# Patient Record
Sex: Male | Born: 1955 | Race: White | Hispanic: No | Smoking: Never smoker
Health system: Southern US, Community
[De-identification: ages and names within clinical notes are randomized; demographics above are authoritative.]

## PROBLEM LIST (undated history)

## (undated) DIAGNOSIS — I252 Old myocardial infarction: Secondary | ICD-10-CM

## (undated) DIAGNOSIS — I10 Essential (primary) hypertension: Secondary | ICD-10-CM

## (undated) DIAGNOSIS — N200 Calculus of kidney: Secondary | ICD-10-CM

## (undated) DIAGNOSIS — E785 Hyperlipidemia, unspecified: Secondary | ICD-10-CM

## (undated) DIAGNOSIS — C4490 Unspecified malignant neoplasm of skin, unspecified: Secondary | ICD-10-CM

## (undated) HISTORY — DX: Old myocardial infarction: I25.2

## (undated) HISTORY — PX: BACK SURGERY: SHX140

## (undated) HISTORY — DX: Hyperlipidemia, unspecified: E78.5

## (undated) HISTORY — DX: Calculus of kidney: N20.0

## (undated) HISTORY — DX: Unspecified malignant neoplasm of skin, unspecified: C44.90

---

## 2004-03-06 ENCOUNTER — Inpatient Hospital Stay (HOSPITAL_COMMUNITY): Admission: RE | Admit: 2004-03-06 | Discharge: 2004-03-13 | Payer: Self-pay | Admitting: Neurosurgery

## 2005-07-29 ENCOUNTER — Ambulatory Visit: Payer: Self-pay | Admitting: Internal Medicine

## 2012-12-10 ENCOUNTER — Ambulatory Visit: Payer: Self-pay | Admitting: Internal Medicine

## 2014-10-17 ENCOUNTER — Ambulatory Visit
Admission: RE | Admit: 2014-10-17 | Discharge: 2014-10-17 | Disposition: A | Discharge status: Home / Self Care | Payer: BLUE CROSS/BLUE SHIELD | Source: Ambulatory Visit | Attending: Gastroenterology | Admitting: Gastroenterology

## 2014-10-17 ENCOUNTER — Encounter
Admission: RE | Disposition: A | Discharge status: Home / Self Care | Payer: Self-pay | Source: Ambulatory Visit | Attending: Gastroenterology

## 2014-10-17 ENCOUNTER — Ambulatory Visit: Payer: BLUE CROSS/BLUE SHIELD | Admitting: Anesthesiology

## 2014-10-17 ENCOUNTER — Encounter: Payer: Self-pay | Admitting: *Deleted

## 2014-10-17 DIAGNOSIS — Z8371 Family history of colonic polyps: Secondary | ICD-10-CM | POA: Diagnosis not present

## 2014-10-17 DIAGNOSIS — Z79899 Other long term (current) drug therapy: Secondary | ICD-10-CM | POA: Diagnosis not present

## 2014-10-17 DIAGNOSIS — E785 Hyperlipidemia, unspecified: Secondary | ICD-10-CM | POA: Insufficient documentation

## 2014-10-17 DIAGNOSIS — D122 Benign neoplasm of ascending colon: Secondary | ICD-10-CM | POA: Insufficient documentation

## 2014-10-17 DIAGNOSIS — I10 Essential (primary) hypertension: Secondary | ICD-10-CM | POA: Diagnosis not present

## 2014-10-17 DIAGNOSIS — D120 Benign neoplasm of cecum: Secondary | ICD-10-CM | POA: Diagnosis not present

## 2014-10-17 DIAGNOSIS — D126 Benign neoplasm of colon, unspecified: Secondary | ICD-10-CM

## 2014-10-17 DIAGNOSIS — Z9889 Other specified postprocedural states: Secondary | ICD-10-CM | POA: Diagnosis not present

## 2014-10-17 DIAGNOSIS — Z1211 Encounter for screening for malignant neoplasm of colon: Secondary | ICD-10-CM | POA: Diagnosis not present

## 2014-10-17 DIAGNOSIS — Z791 Long term (current) use of non-steroidal anti-inflammatories (NSAID): Secondary | ICD-10-CM | POA: Insufficient documentation

## 2014-10-17 HISTORY — PX: COLONOSCOPY: SHX5424

## 2014-10-17 HISTORY — DX: Benign neoplasm of colon, unspecified: D12.6

## 2014-10-17 HISTORY — DX: Essential (primary) hypertension: I10

## 2014-10-17 SURGERY — COLONOSCOPY
Anesthesia: General

## 2014-10-17 MED ORDER — FENTANYL CITRATE (PF) 100 MCG/2ML IJ SOLN
INTRAMUSCULAR | Status: DC | PRN
  Administered 2014-10-17: 50 ug via INTRAVENOUS

## 2014-10-17 MED ORDER — METOPROLOL SUCCINATE ER 25 MG PO TB24
ORAL_TABLET | ORAL | Status: AC
  Administered 2014-10-17: 25 mg via ORAL
  Filled 2014-10-17: qty 1

## 2014-10-17 MED ORDER — SODIUM CHLORIDE 0.9 % IV SOLN
INTRAVENOUS | Status: DC
  Administered 2014-10-17: 08:00:00 via INTRAVENOUS

## 2014-10-17 MED ORDER — PROPOFOL INFUSION 10 MG/ML OPTIME
INTRAVENOUS | Status: DC | PRN
  Administered 2014-10-17: 140 ug/kg/min via INTRAVENOUS

## 2014-10-17 MED ORDER — SODIUM CHLORIDE 0.9 % IV SOLN: INTRAVENOUS | Status: DC

## 2014-10-17 MED ORDER — METOPROLOL SUCCINATE ER 25 MG PO TB24
ORAL_TABLET | ORAL | Status: AC
  Filled 2014-10-17: qty 1

## 2014-10-17 MED ORDER — METOPROLOL SUCCINATE ER 50 MG PO TB24
50.0000 mg | ORAL_TABLET | Freq: Once | ORAL | Status: AC
  Administered 2014-10-17: 25 mg via ORAL

## 2014-10-17 MED ORDER — MIDAZOLAM HCL 2 MG/2ML IJ SOLN
INTRAMUSCULAR | Status: DC | PRN
  Administered 2014-10-17: 1 mg via INTRAVENOUS

## 2014-10-17 NOTE — Transfer of Care (Signed)
Immediate Anesthesia Transfer of Care Note  Patient: Dennis Peck  Procedure(s) Performed: Procedure(s): COLONOSCOPY (N/A)  Patient Location: PACU  Anesthesia Type:General  Level of Consciousness: awake, alert  and oriented  Airway & Oxygen Therapy: Patient Spontanous Breathing and Patient connected to nasal cannula oxygen  Post-op Assessment: Report given to RN and Post -op Vital signs reviewed and stable  Post vital signs: Reviewed and stable  Last Vitals:  Filed Vitals:   10/17/14 0902  BP:   Pulse: 65  Temp:   Resp: 10    Complications: No apparent anesthesia complications

## 2014-10-17 NOTE — Anesthesia Postprocedure Evaluation (Signed)
  Anesthesia Post-op Note  Patient: Dennis Peck  Procedure(s) Performed: Procedure(s): COLONOSCOPY (N/A)  Anesthesia type:General  Patient location: PACU  Post pain: Pain level controlled  Post assessment: Post-op Vital signs reviewed, Patient's Cardiovascular Status Stable, Respiratory Function Stable, Patent Airway and No signs of Nausea or vomiting  Post vital signs: Reviewed and stable  Last Vitals:  Filed Vitals:   10/17/14 0930  BP: 140/88  Pulse: 57  Temp:   Resp: 13    Level of consciousness: awake, alert  and patient cooperative  Complications: No apparent anesthesia complications

## 2014-10-17 NOTE — Anesthesia Preprocedure Evaluation (Signed)
Anesthesia Evaluation  Patient identified by MRN, date of birth, ID band Patient awake    Reviewed: Allergy & Precautions, NPO status , Patient's Chart, lab work & pertinent test results, reviewed documented beta blocker date and time   Airway Mallampati: III  TM Distance: >3 FB     Dental  (+) Chipped   Pulmonary          Cardiovascular hypertension,     Neuro/Psych    GI/Hepatic   Endo/Other    Renal/GU      Musculoskeletal   Abdominal   Peds  Hematology   Anesthesia Other Findings Obesity.  Reproductive/Obstetrics                             Anesthesia Physical Anesthesia Plan  ASA: III  Anesthesia Plan: General   Post-op Pain Management:    Induction: Intravenous  Airway Management Planned: Nasal Cannula  Additional Equipment:   Intra-op Plan:   Post-operative Plan:   Informed Consent: I have reviewed the patients History and Physical, chart, labs and discussed the procedure including the risks, benefits and alternatives for the proposed anesthesia with the patient or authorized representative who has indicated his/her understanding and acceptance.     Plan Discussed with: CRNA  Anesthesia Plan Comments:         Anesthesia Quick Evaluation

## 2014-10-17 NOTE — Anesthesia Procedure Notes (Signed)
Date/Time: 10/17/2014 8:30 AM Performed by: Christy Sartorius Patient Re-evaluated:Patient Re-evaluated prior to inductionOxygen Delivery Method: Nasal cannula Intubation Type: IV induction Grade View: Grade III Placement Confirmation: positive ETCO2 Dental Injury: Teeth and Oropharynx as per pre-operative assessment

## 2014-10-17 NOTE — H&P (Signed)
  Primary Care Physician:  Rusty Aus., MD  Pre-Procedure History & Physical: HPI:  Dennis Peck is a 59 y.o. male is here for an colonoscopy.   Past Medical History  Diagnosis Date  . Hypertension     Past Surgical History  Procedure Laterality Date  . Back surgery      Prior to Admission medications   Medication Sig Start Date End Date Taking? Authorizing Provider  etodolac (LODINE) 400 MG tablet Take 400 mg by mouth 2 (two) times daily.   Yes Historical Provider, MD  geriatric multivitamins-minerals (ELDERTONIC/GEVRABON) ELIX Take 15 mLs by mouth daily.   Yes Historical Provider, MD  glucosamine-chondroitin 500-400 MG tablet Take 1 tablet by mouth 3 (three) times daily.   Yes Historical Provider, MD  hydrochlorothiazide (HYDRODIURIL) 50 MG tablet Take 50 mg by mouth daily.   Yes Historical Provider, MD  metoprolol succinate (TOPROL-XL) 50 MG 24 hr tablet Take 50 mg by mouth daily. Take with or immediately following a meal.   Yes Historical Provider, MD  potassium chloride (K-DUR) 10 MEQ tablet Take 10 mEq by mouth daily.   Yes Historical Provider, MD  sertraline (ZOLOFT) 50 MG tablet Take 50 mg by mouth daily.   Yes Historical Provider, MD  Specialty Vitamins Products (MAGNESIUM, AMINO ACID CHELATE,) 133 MG tablet Take 1 tablet by mouth 2 (two) times daily.   Yes Historical Provider, MD  telmisartan (MICARDIS) 80 MG tablet Take 80 mg by mouth daily.   Yes Historical Provider, MD    Allergies as of 10/07/2014  . (Not on File)    History reviewed. No pertinent family history.  History   Social History  . Marital Status: Married    Spouse Name: N/A  . Number of Children: N/A  . Years of Education: N/A   Occupational History  . Not on file.   Social History Main Topics  . Smoking status: Never Smoker   . Smokeless tobacco: Current User    Types: Chew  . Alcohol Use: 16.8 oz/week    28 Glasses of wine per week  . Drug Use: No  . Sexual Activity: Not on file    Other Topics Concern  . Not on file   Social History Narrative  . No narrative on file     Physical Exam: BP 136/85 mmHg  Pulse 72  Temp(Src) 98 F (36.7 C) (Tympanic)  Resp 14  Ht 5\' 9"  (1.753 m)  Wt 240 lb (108.863 kg)  BMI 35.43 kg/m2  SpO2 97% General:   Alert,  pleasant and cooperative in NAD Head:  Normocephalic and atraumatic. Neck:  Supple; no masses or thyromegaly. Lungs:  Clear throughout to auscultation.    Heart:  Regular rate and rhythm. Abdomen:  Soft, nontender and nondistended. Normal bowel sounds, without guarding, and without rebound.   Neurologic:  Alert and  oriented x4;  grossly normal neurologically.  Impression/Plan: Dennis Peck is here for an colonoscopy to be performed for screening  Risks, benefits, limitations, and alternatives regarding  colonoscopy have been reviewed with the patient.  Questions have been answered.  All parties agreeable.   Josefine Class, MD  10/17/2014, 8:25 AM

## 2014-10-17 NOTE — Discharge Instructions (Signed)

## 2014-10-17 NOTE — Op Note (Addendum)
[facility] Patient Name: Dennis Peck Procedure Date: 10/17/2014 8:22 AM MRN: [id number] Account #: [id number] Date of Birth: 1955/10/31 Admit Type: Outpatient Age: 59 Room: [facility] ENDO ROOM 3 Gender: Male Note Status: Supervisor Override Procedure:         Colonoscopy Indications:       Colon cancer screening in patient at increased risk:                     Family history of colon polyps, This is the patient's                     first colonoscopy Patient Profile:   This is a 59 year old male. Providers:         Gerrit Heck. Rayann Heman, MD Referring MD:      Rusty Aus, MD (Referring MD) Medicines:         Propofol per Anesthesia Complications:     No immediate complications. Procedure:         Pre-Anesthesia Assessment:                    - Prior to the procedure, a History and Physical was                     performed, and patient medications, allergies and                     sensitivities were reviewed. The patient's tolerance of                     previous anesthesia was reviewed.                    After obtaining informed consent, the colonoscope was                     passed under direct vision. Throughout the procedure, the                     patient's blood pressure, pulse, and oxygen saturations                     were monitored continuously. The Olympus CF-H180AL                     colonoscope ( S#: [id number] ) was introduced through the                     anus and advanced to the the cecum, identified by                     appendiceal orifice and ileocecal valve. The colonoscopy                     was performed without difficulty. The patient tolerated                     the procedure well. The quality of the bowel preparation                     was excellent. Findings:      The perianal and digital rectal examinations were normal.      A 10 mm polyp was found in the cecum. The polyp was flat. The polyp was  removed  with a hot snare. The polyp was removed with a saline       injection-lift technique using a hot snare. Resection and retrieval were       complete.      Two sessile polyps were found in the ascending colon and in the proximal       ascending colon. The polyps were 3 to 4 mm in size. These polyps were       removed with a cold snare. Resection and retrieval were complete.      The exam was otherwise without abnormality on direct and retroflexion       views. Impression:        - One 10 mm polyp in the cecum. Resected and retrieved.                    - Two 3 to 4 mm polyps in the ascending colon and in the                     proximal ascending colon. Resected and retrieved.                    - The examination was otherwise normal on direct and                     retroflexion views. Recommendation:    - Observe patient in GI recovery unit.                    - Continue present medications.                    - Await pathology results.                    - Repeat colonoscopy for surveillance based on pathology                     results.                    - Return to referring physician.                    - The findings and recommendations were discussed with the                     patient.                    - The findings and recommendations were discussed with the                     patient's family. Procedure Code(s): --- Professional ---                    [phone], 41, Colonoscopy, flexible; with endoscopic mucosal                     resection                    [phone], Colonoscopy, flexible; with removal of tumor(s),                     polyp(s), or other lesion(s) by snare technique CPT copyright 2014 American Medical Association. All rights reserved. The codes documented in this report are preliminary and upon coder review may  be revised to meet  current compliance requirements. Mellody Life, MD 10/17/2014 8:57:15 AM This report has been signed electronically. Number of  Addenda: 0 Note Initiated On: 10/17/2014 8:22 AM Total Procedure Duration: 0 hours 21 minutes 2 seconds       [facility]

## 2014-10-18 LAB — SURGICAL PATHOLOGY

## 2014-10-19 ENCOUNTER — Encounter: Payer: Self-pay | Admitting: Gastroenterology

## 2019-04-20 ENCOUNTER — Encounter
Admission: EM | Disposition: A | Discharge status: Home / Self Care | Payer: Self-pay | Source: Home / Self Care | Attending: Internal Medicine

## 2019-04-20 ENCOUNTER — Other Ambulatory Visit: Payer: Self-pay

## 2019-04-20 ENCOUNTER — Inpatient Hospital Stay
Admit: 2019-04-20 | Discharge: 2019-04-20 | Disposition: A | Discharge status: Home / Self Care | Payer: Managed Care, Other (non HMO) | Attending: Internal Medicine | Admitting: Internal Medicine

## 2019-04-20 ENCOUNTER — Inpatient Hospital Stay
Admission: EM | Admit: 2019-04-20 | Discharge: 2019-04-22 | DRG: 247 | Disposition: A | Discharge status: Home / Self Care | Payer: Managed Care, Other (non HMO) | Attending: Internal Medicine | Admitting: Internal Medicine

## 2019-04-20 DIAGNOSIS — E785 Hyperlipidemia, unspecified: Secondary | ICD-10-CM

## 2019-04-20 DIAGNOSIS — I255 Ischemic cardiomyopathy: Secondary | ICD-10-CM | POA: Diagnosis present

## 2019-04-20 DIAGNOSIS — Z20828 Contact with and (suspected) exposure to other viral communicable diseases: Secondary | ICD-10-CM | POA: Diagnosis present

## 2019-04-20 DIAGNOSIS — E781 Pure hyperglyceridemia: Secondary | ICD-10-CM | POA: Diagnosis present

## 2019-04-20 DIAGNOSIS — Z8249 Family history of ischemic heart disease and other diseases of the circulatory system: Secondary | ICD-10-CM

## 2019-04-20 DIAGNOSIS — Z833 Family history of diabetes mellitus: Secondary | ICD-10-CM

## 2019-04-20 DIAGNOSIS — Z79899 Other long term (current) drug therapy: Secondary | ICD-10-CM

## 2019-04-20 DIAGNOSIS — E782 Mixed hyperlipidemia: Secondary | ICD-10-CM | POA: Diagnosis not present

## 2019-04-20 DIAGNOSIS — Z7982 Long term (current) use of aspirin: Secondary | ICD-10-CM | POA: Diagnosis not present

## 2019-04-20 DIAGNOSIS — F17220 Nicotine dependence, chewing tobacco, uncomplicated: Secondary | ICD-10-CM | POA: Diagnosis present

## 2019-04-20 DIAGNOSIS — I10 Essential (primary) hypertension: Secondary | ICD-10-CM

## 2019-04-20 DIAGNOSIS — I213 ST elevation (STEMI) myocardial infarction of unspecified site: Secondary | ICD-10-CM | POA: Diagnosis present

## 2019-04-20 DIAGNOSIS — I2510 Atherosclerotic heart disease of native coronary artery without angina pectoris: Secondary | ICD-10-CM

## 2019-04-20 DIAGNOSIS — N179 Acute kidney failure, unspecified: Secondary | ICD-10-CM | POA: Diagnosis present

## 2019-04-20 DIAGNOSIS — I2102 ST elevation (STEMI) myocardial infarction involving left anterior descending coronary artery: Secondary | ICD-10-CM | POA: Diagnosis present

## 2019-04-20 HISTORY — PX: CORONARY/GRAFT ACUTE MI REVASCULARIZATION: CATH118305

## 2019-04-20 HISTORY — PX: LEFT HEART CATH AND CORONARY ANGIOGRAPHY: CATH118249

## 2019-04-20 LAB — CBC
HCT: 34.4 % — ABNORMAL LOW (ref 39.0–52.0)
Hemoglobin: 12.8 g/dL — ABNORMAL LOW (ref 13.0–17.0)
MCH: 29.8 pg (ref 26.0–34.0)
MCHC: 37.2 g/dL — ABNORMAL HIGH (ref 30.0–36.0)
MCV: 80.2 fL (ref 80.0–100.0)
Platelets: 181 K/uL (ref 150–400)
RBC: 4.29 MIL/uL (ref 4.22–5.81)
RDW: 12.9 % (ref 11.5–15.5)
WBC: 9.6 K/uL (ref 4.0–10.5)
nRBC: 0 % (ref 0.0–0.2)

## 2019-04-20 LAB — COMPREHENSIVE METABOLIC PANEL
ALT: 82 U/L — ABNORMAL HIGH (ref 0–44)
AST: 64 U/L — ABNORMAL HIGH (ref 15–41)
Albumin: 4.4 g/dL (ref 3.5–5.0)
Alkaline Phosphatase: 70 U/L (ref 38–126)
Anion gap: 13 (ref 5–15)
BUN: 34 mg/dL — ABNORMAL HIGH (ref 8–23)
CO2: 24 mmol/L (ref 22–32)
Calcium: 9.5 mg/dL (ref 8.9–10.3)
Chloride: 99 mmol/L (ref 98–111)
Creatinine, Ser: 1.53 mg/dL — ABNORMAL HIGH (ref 0.61–1.24)
GFR calc Af Amer: 56 mL/min — ABNORMAL LOW (ref >60)
GFR calc non Af Amer: 48 mL/min — ABNORMAL LOW (ref >60)
Glucose, Bld: 179 mg/dL — ABNORMAL HIGH (ref 70–99)
Potassium: 3.4 mmol/L — ABNORMAL LOW (ref 3.5–5.1)
Sodium: 136 mmol/L (ref 135–145)
Total Bilirubin: 0.8 mg/dL (ref 0.3–1.2)
Total Protein: 7 g/dL (ref 6.5–8.1)

## 2019-04-20 LAB — BASIC METABOLIC PANEL WITH GFR
Anion gap: 12 (ref 5–15)
BUN: 34 mg/dL — ABNORMAL HIGH (ref 8–23)
CO2: 24 mmol/L (ref 22–32)
Calcium: 9 mg/dL (ref 8.9–10.3)
Chloride: 100 mmol/L (ref 98–111)
Creatinine, Ser: 1.37 mg/dL — ABNORMAL HIGH (ref 0.61–1.24)
GFR calc Af Amer: >60 mL/min
GFR calc non Af Amer: 55 mL/min — ABNORMAL LOW
Glucose, Bld: 173 mg/dL — ABNORMAL HIGH (ref 70–99)
Potassium: 3.5 mmol/L (ref 3.5–5.1)
Sodium: 136 mmol/L (ref 135–145)

## 2019-04-20 LAB — CBC WITH DIFFERENTIAL/PLATELET
Abs Immature Granulocytes: 0.12 10*3/uL — ABNORMAL HIGH (ref 0.00–0.07)
Basophils Absolute: 0.1 10*3/uL (ref 0.0–0.1)
Basophils Relative: 1 %
Eosinophils Absolute: 0.2 10*3/uL (ref 0.0–0.5)
Eosinophils Relative: 2 %
HCT: 36.7 % — ABNORMAL LOW (ref 39.0–52.0)
Hemoglobin: 13.6 g/dL (ref 13.0–17.0)
Immature Granulocytes: 1 %
Lymphocytes Relative: 24 %
Lymphs Abs: 2.3 10*3/uL (ref 0.7–4.0)
MCH: 29.8 pg (ref 26.0–34.0)
MCHC: 37.1 g/dL — ABNORMAL HIGH (ref 30.0–36.0)
MCV: 80.3 fL (ref 80.0–100.0)
Monocytes Absolute: 1 10*3/uL (ref 0.1–1.0)
Monocytes Relative: 11 %
Neutro Abs: 6 10*3/uL (ref 1.7–7.7)
Neutrophils Relative %: 61 %
Platelets: 183 10*3/uL (ref 150–400)
RBC: 4.57 MIL/uL (ref 4.22–5.81)
RDW: 12.8 % (ref 11.5–15.5)
WBC: 9.6 10*3/uL (ref 4.0–10.5)
nRBC: 0 % (ref 0.0–0.2)

## 2019-04-20 LAB — TROPONIN I (HIGH SENSITIVITY)
Troponin I (High Sensitivity): 532 ng/L (ref <18)
Troponin I (High Sensitivity): 32674 ng/L (ref <18)
Troponin I (High Sensitivity): 91864 ng/L
Troponin I (High Sensitivity): 58859 ng/L (ref <18)
Troponin I (High Sensitivity): 26431 ng/L (ref <18)

## 2019-04-20 LAB — LIPID PANEL
Cholesterol: 302 mg/dL — ABNORMAL HIGH (ref 0–200)
Cholesterol: 283 mg/dL — ABNORMAL HIGH (ref 0–200)
HDL: 39 mg/dL — ABNORMAL LOW (ref >40)
HDL: 40 mg/dL — ABNORMAL LOW (ref >40)
LDL Cholesterol: UNDETERMINED mg/dL (ref 0–99)
LDL Cholesterol: UNDETERMINED mg/dL (ref 0–99)
Total CHOL/HDL Ratio: 7.7 RATIO
Total CHOL/HDL Ratio: 7.1 RATIO
Triglycerides: 832 mg/dL — ABNORMAL HIGH (ref <150)
Triglycerides: 412 mg/dL — ABNORMAL HIGH (ref <150)
VLDL: UNDETERMINED mg/dL (ref 0–40)
VLDL: UNDETERMINED mg/dL (ref 0–40)

## 2019-04-20 LAB — HEMOGLOBIN A1C
Hgb A1c MFr Bld: 7.3 % — ABNORMAL HIGH (ref 4.8–5.6)
Mean Plasma Glucose: 162.81 mg/dL

## 2019-04-20 LAB — LDL CHOLESTEROL, DIRECT
Direct LDL: 157.4 mg/dL — ABNORMAL HIGH (ref 0–99)
Direct LDL: 172.3 mg/dL — ABNORMAL HIGH (ref 0–99)

## 2019-04-20 LAB — GLUCOSE, CAPILLARY
Glucose-Capillary: 168 mg/dL — ABNORMAL HIGH (ref 70–99)
Glucose-Capillary: 189 mg/dL — ABNORMAL HIGH (ref 70–99)
Glucose-Capillary: 235 mg/dL — ABNORMAL HIGH (ref 70–99)

## 2019-04-20 LAB — POCT ACTIVATED CLOTTING TIME
Activated Clotting Time: 213 seconds
Activated Clotting Time: 230 s
Activated Clotting Time: 224 s

## 2019-04-20 LAB — MRSA PCR SCREENING: MRSA by PCR: NEGATIVE

## 2019-04-20 LAB — APTT: aPTT: 28 seconds (ref 24–36)

## 2019-04-20 LAB — RESPIRATORY PANEL BY RT PCR (FLU A&B, COVID)
Influenza A by PCR: NEGATIVE
Influenza B by PCR: NEGATIVE
SARS Coronavirus 2 by RT PCR: NEGATIVE

## 2019-04-20 LAB — PROTIME-INR
INR: 1 (ref 0.8–1.2)
Prothrombin Time: 13 seconds (ref 11.4–15.2)

## 2019-04-20 SURGERY — CORONARY/GRAFT ACUTE MI REVASCULARIZATION
Anesthesia: Moderate Sedation

## 2019-04-20 MED ORDER — LABETALOL HCL 5 MG/ML IV SOLN: 10.0000 mg | INTRAVENOUS | Status: AC | PRN

## 2019-04-20 MED ORDER — NITROGLYCERIN 1 MG/10 ML FOR IR/CATH LAB
INTRA_ARTERIAL | Status: AC
  Filled 2019-04-20: qty 10

## 2019-04-20 MED ORDER — HEPARIN SODIUM (PORCINE) 1000 UNIT/ML IJ SOLN
INTRAMUSCULAR | Status: AC
  Filled 2019-04-20: qty 1

## 2019-04-20 MED ORDER — MIDAZOLAM HCL 2 MG/2ML IJ SOLN
INTRAMUSCULAR | Status: AC
  Filled 2019-04-20: qty 2

## 2019-04-20 MED ORDER — SERTRALINE HCL 50 MG PO TABS
50.0000 mg | ORAL_TABLET | Freq: Every day | ORAL | Status: DC
  Administered 2019-04-20 – 2019-04-22 (×3): 50 mg via ORAL
  Filled 2019-04-20 (×3): qty 1

## 2019-04-20 MED ORDER — TIROFIBAN HCL IN NACL 5-0.9 MG/100ML-% IV SOLN
0.1500 ug/kg/min | INTRAVENOUS | Status: AC
  Filled 2019-04-20: qty 100

## 2019-04-20 MED ORDER — CYCLOPENTOLATE HCL 1 % OP SOLN: 1.0000 [drp] | Freq: Three times a day (TID) | OPHTHALMIC | 0 refills | Status: DC | PRN

## 2019-04-20 MED ORDER — MORPHINE SULFATE (PF) 2 MG/ML IV SOLN
2.0000 mg | INTRAVENOUS | Status: DC | PRN
  Administered 2019-04-20 (×3): 2 mg via INTRAVENOUS
  Filled 2019-04-20 (×2): qty 1

## 2019-04-20 MED ORDER — METOPROLOL SUCCINATE ER 25 MG PO TB24
25.0000 mg | ORAL_TABLET | Freq: Every day | ORAL | Status: DC
  Administered 2019-04-20 – 2019-04-22 (×3): 25 mg via ORAL
  Filled 2019-04-20 (×3): qty 1

## 2019-04-20 MED ORDER — VERAPAMIL HCL 2.5 MG/ML IV SOLN
INTRAVENOUS | Status: AC
  Filled 2019-04-20: qty 2

## 2019-04-20 MED ORDER — FENTANYL CITRATE (PF) 100 MCG/2ML IJ SOLN
INTRAMUSCULAR | Status: DC | PRN
  Administered 2019-04-20 (×3): 25 ug via INTRAVENOUS

## 2019-04-20 MED ORDER — MORPHINE SULFATE (PF) 2 MG/ML IV SOLN
INTRAVENOUS | Status: AC
  Filled 2019-04-20: qty 1

## 2019-04-20 MED ORDER — ACETAMINOPHEN 325 MG PO TABS: 650.0000 mg | ORAL_TABLET | ORAL | Status: DC | PRN

## 2019-04-20 MED ORDER — HEPARIN SODIUM (PORCINE) 1000 UNIT/ML IJ SOLN
INTRAMUSCULAR | Status: DC | PRN
  Administered 2019-04-20: 7000 [IU] via INTRAVENOUS
  Administered 2019-04-20: 4000 [IU] via INTRAVENOUS

## 2019-04-20 MED ORDER — FENTANYL CITRATE (PF) 100 MCG/2ML IJ SOLN
INTRAMUSCULAR | Status: AC
  Filled 2019-04-20: qty 2

## 2019-04-20 MED ORDER — TICAGRELOR 90 MG PO TABS
ORAL_TABLET | ORAL | Status: DC | PRN
  Administered 2019-04-20: 180 mg via ORAL

## 2019-04-20 MED ORDER — IOHEXOL 300 MG/ML  SOLN
INTRAMUSCULAR | Status: DC | PRN
  Administered 2019-04-20: 315 mL

## 2019-04-20 MED ORDER — SODIUM CHLORIDE 0.9% FLUSH
3.0000 mL | Freq: Two times a day (BID) | INTRAVENOUS | Status: DC
  Administered 2019-04-20 – 2019-04-21 (×3): 3 mL via INTRAVENOUS

## 2019-04-20 MED ORDER — HEPARIN SODIUM (PORCINE) 5000 UNIT/ML IJ SOLN
5000.0000 [IU] | Freq: Three times a day (TID) | INTRAMUSCULAR | Status: DC
  Administered 2019-04-20 – 2019-04-22 (×5): 5000 [IU] via SUBCUTANEOUS
  Filled 2019-04-20 (×5): qty 1

## 2019-04-20 MED ORDER — TIROFIBAN HCL IV 12.5 MG/250 ML
INTRAVENOUS | Status: AC | PRN
  Administered 2019-04-20: 0.15 ug/kg/min via INTRAVENOUS

## 2019-04-20 MED ORDER — NITROGLYCERIN IN D5W 200-5 MCG/ML-% IV SOLN
0.0000 ug/min | INTRAVENOUS | Status: DC
  Administered 2019-04-20: 5 ug/min via INTRAVENOUS
  Filled 2019-04-20: qty 250

## 2019-04-20 MED ORDER — MORPHINE SULFATE (PF) 2 MG/ML IV SOLN
2.0000 mg | Freq: Once | INTRAVENOUS | Status: AC
  Administered 2019-04-20: 2 mg via INTRAVENOUS
  Filled 2019-04-20: qty 1

## 2019-04-20 MED ORDER — POTASSIUM CHLORIDE CRYS ER 20 MEQ PO TBCR
40.0000 meq | EXTENDED_RELEASE_TABLET | Freq: Once | ORAL | Status: AC
  Administered 2019-04-20: 40 meq via ORAL

## 2019-04-20 MED ORDER — ASPIRIN 81 MG PO CHEW
324.0000 mg | CHEWABLE_TABLET | Freq: Once | ORAL | Status: AC
  Administered 2019-04-20: 324 mg via ORAL
  Filled 2019-04-20: qty 4

## 2019-04-20 MED ORDER — SODIUM CHLORIDE 0.9% FLUSH: 3.0000 mL | INTRAVENOUS | Status: DC | PRN

## 2019-04-20 MED ORDER — TICAGRELOR 90 MG PO TABS
ORAL_TABLET | ORAL | Status: AC
  Filled 2019-04-20: qty 2

## 2019-04-20 MED ORDER — SODIUM CHLORIDE 0.9 % IV SOLN: 250.0000 mL | INTRAVENOUS | Status: DC | PRN

## 2019-04-20 MED ORDER — HEPARIN (PORCINE) IN NACL 1000-0.9 UT/500ML-% IV SOLN
INTRAVENOUS | Status: DC | PRN
  Administered 2019-04-20: 1000 mL

## 2019-04-20 MED ORDER — HYDRALAZINE HCL 20 MG/ML IJ SOLN: 10.0000 mg | INTRAMUSCULAR | Status: AC | PRN

## 2019-04-20 MED ORDER — TICAGRELOR 90 MG PO TABS
90.0000 mg | ORAL_TABLET | Freq: Two times a day (BID) | ORAL | Status: DC
  Administered 2019-04-20 – 2019-04-21 (×3): 90 mg via ORAL
  Filled 2019-04-20 (×3): qty 1

## 2019-04-20 MED ORDER — MIDAZOLAM HCL 2 MG/2ML IJ SOLN
INTRAMUSCULAR | Status: DC | PRN
  Administered 2019-04-20: 1 mg via INTRAVENOUS
  Administered 2019-04-20 (×2): 0.5 mg via INTRAVENOUS

## 2019-04-20 MED ORDER — HEART ATTACK BOUNCING BOOK: Freq: Once | Status: DC

## 2019-04-20 MED ORDER — NITROGLYCERIN 0.4 MG SL SUBL: 0.4000 mg | SUBLINGUAL_TABLET | SUBLINGUAL | Status: DC | PRN

## 2019-04-20 MED ORDER — TIROFIBAN (AGGRASTAT) BOLUS VIA INFUSION
INTRAVENOUS | Status: DC | PRN
  Administered 2019-04-20: 2722.5 ug via INTRAVENOUS

## 2019-04-20 MED ORDER — MORPHINE SULFATE (PF) 2 MG/ML IV SOLN
2.0000 mg | Freq: Once | INTRAVENOUS | Status: AC
  Administered 2019-04-20: 2 mg via INTRAVENOUS

## 2019-04-20 MED ORDER — PREDNISONE 20 MG PO TABS: 60.0000 mg | ORAL_TABLET | Freq: Every day | ORAL | 0 refills | Status: DC

## 2019-04-20 MED ORDER — SODIUM CHLORIDE 0.9 % IV SOLN: INTRAVENOUS | Status: AC

## 2019-04-20 MED ORDER — ATORVASTATIN CALCIUM 80 MG PO TABS
80.0000 mg | ORAL_TABLET | Freq: Every day | ORAL | Status: DC
  Administered 2019-04-20 – 2019-04-21 (×2): 80 mg via ORAL
  Filled 2019-04-20 (×3): qty 1
  Filled 2019-04-20: qty 4

## 2019-04-20 MED ORDER — NITROGLYCERIN 1 MG/10 ML FOR IR/CATH LAB
INTRA_ARTERIAL | Status: DC | PRN
  Administered 2019-04-20: 200 ug via INTRACORONARY
  Administered 2019-04-20: 200 ug
  Administered 2019-04-20: 200 ug via INTRACORONARY

## 2019-04-20 MED ORDER — CHLORHEXIDINE GLUCONATE CLOTH 2 % EX PADS: 6.0000 | MEDICATED_PAD | Freq: Every day | CUTANEOUS | Status: DC

## 2019-04-20 MED ORDER — ONDANSETRON HCL 4 MG/2ML IJ SOLN: 4.0000 mg | Freq: Four times a day (QID) | INTRAMUSCULAR | Status: DC | PRN

## 2019-04-20 MED ORDER — SODIUM CHLORIDE 0.9 % IV SOLN
INTRAVENOUS | Status: DC
  Administered 2019-04-20: 20 mL/h via INTRAVENOUS

## 2019-04-20 MED ORDER — ASPIRIN 81 MG PO CHEW
81.0000 mg | CHEWABLE_TABLET | Freq: Every day | ORAL | Status: DC
  Administered 2019-04-20 – 2019-04-22 (×3): 81 mg via ORAL
  Filled 2019-04-20 (×3): qty 1

## 2019-04-20 SURGICAL SUPPLY — 19 items
BALLN EUPHORA RX 2.0X12 (BALLOONS) ×2
BALLN ~~LOC~~ EUPHORA RX 3.0X20 (BALLOONS) ×2
BALLN ~~LOC~~ TREK RX 3.5X12 (BALLOONS) ×2
BALLOON EUPHORA RX 2.0X12 (BALLOONS) IMPLANT
BALLOON ~~LOC~~ EUPHORA RX 3.0X20 (BALLOONS) IMPLANT
BALLOON ~~LOC~~ TREK RX 3.5X12 (BALLOONS) IMPLANT
CATH INFINITI JR4 5F (CATHETERS) ×1 IMPLANT
CATH LAUNCHER 6FR EBU3.5 (CATHETERS) ×1 IMPLANT
CATH VISTA GUIDE 6FR JL3.5 (CATHETERS) ×1 IMPLANT
CATH VISTA GUIDE 6FR XBLAD3.5 (CATHETERS) ×1 IMPLANT
DEVICE INFLAT 30 PLUS (MISCELLANEOUS) ×1 IMPLANT
DEVICE RAD COMP TR BAND LRG (VASCULAR PRODUCTS) ×1 IMPLANT
GLIDESHEATH SLEND SS 6F .021 (SHEATH) ×1 IMPLANT
KIT MANI 3VAL PERCEP (MISCELLANEOUS) ×2 IMPLANT
PACK CARDIAC CATH (CUSTOM PROCEDURE TRAY) ×2 IMPLANT
STENT RESOLUTE ONYX 2.75X38 (Permanent Stent) ×1 IMPLANT
WIRE ASAHI PROWATER 180CM (WIRE) ×1 IMPLANT
WIRE ROSEN-J .035X260CM (WIRE) ×1 IMPLANT
WIRE RUNTHROUGH .014X180CM (WIRE) ×1 IMPLANT

## 2019-04-20 NOTE — Progress Notes (Signed)
MEDICATION RELATED CONSULT NOTE - INITIAL   Pharmacy Consult for tirofiban Indication: PCI post-cath s/t STEMI  No Known Allergies  Patient Measurements: Height: 5' 9.02" (175.3 cm) Weight: 240 lb 1.3 oz (108.9 kg) IBW/kg (Calculated) : 70.74  Vital Signs: Temp: 98.7 F (37.1 C) (12/29 0024) Temp Source: Oral (12/29 0024) BP: 138/81 (12/29 0108) Pulse Rate: 82 (12/29 0108) Intake/Output from previous day: No intake/output data recorded. Intake/Output from this shift: No intake/output data recorded.  Labs: Recent Labs    04/20/19 0021  WBC 9.6  HGB 13.6  HCT 36.7*  PLT 183  APTT 28  CREATININE 1.53*  ALBUMIN 4.4  PROT 7.0  AST 64*  ALT 82*  ALKPHOS 70  BILITOT 0.8   Estimated Creatinine Clearance: 60.9 mL/min (A) (by C-G formula based on SCr of 1.53 mg/dL (H)).   Microbiology: Recent Results (from the past 720 hour(s))  Respiratory Panel by RT PCR (Flu A&B, Covid) - Nasopharyngeal Swab     Status: None   Collection Time: 04/20/19  1:17 AM   Specimen: Nasopharyngeal Swab  Result Value Ref Range Status   SARS Coronavirus 2 by RT PCR NEGATIVE NEGATIVE Final    Comment: (NOTE) SARS-CoV-2 target nucleic acids are NOT DETECTED. The SARS-CoV-2 RNA is generally detectable in upper respiratoy specimens during the acute phase of infection. The lowest concentration of SARS-CoV-2 viral copies this assay can detect is 131 copies/mL. A negative result does not preclude SARS-Cov-2 infection and should not be used as the sole basis for treatment or other patient management decisions. A negative result may occur with  improper specimen collection/handling, submission of specimen other than nasopharyngeal swab, presence of viral mutation(s) within the areas targeted by this assay, and inadequate number of viral copies (<131 copies/mL). A negative result must be combined with clinical observations, patient history, and epidemiological information. The expected result is  Negative. Fact Sheet for Patients:  PinkCheek.be Fact Sheet for Healthcare Providers:  GravelBags.it This test is not yet ap proved or cleared by the [place] FDA and  has been authorized for detection and/or diagnosis of SARS-CoV-2 by FDA under an Emergency Use Authorization (EUA). This EUA will remain  in effect (meaning this test can be used) for the duration of the COVID-19 declaration under Section 564(b)(1) of the Act, 21 U.S.C. section 360bbb-3(b)(1), unless the authorization is terminated or revoked sooner.    Influenza A by PCR NEGATIVE NEGATIVE Final   Influenza B by PCR NEGATIVE NEGATIVE Final    Comment: (NOTE) The Xpert Xpress SARS-CoV-2/FLU/RSV assay is intended as an aid in  the diagnosis of influenza from Nasopharyngeal swab specimens and  should not be used as a sole basis for treatment. Nasal washings and  aspirates are unacceptable for Xpert Xpress SARS-CoV-2/FLU/RSV  testing. Fact Sheet for Patients: PinkCheek.be Fact Sheet for Healthcare Providers: GravelBags.it This test is not yet approved or cleared by the [place] FDA and  has been authorized for detection and/or diagnosis of SARS-CoV-2 by  FDA under an Emergency Use Authorization (EUA). This EUA will remain  in effect (meaning this test can be used) for the duration of the  Covid-19 declaration under Section 564(b)(1) of the Act, 21  U.S.C. section 360bbb-3(b)(1), unless the authorization is  terminated or revoked. Performed at [facility], [address], [place], [place] [address]     Medical History: Past Medical History:  Diagnosis Date  . Hypertension     Medications:  Scheduled:  . aspirin  81 mg  Oral Daily  . atorvastatin  80 mg Oral q1800  . Chlorhexidine Gluconate Cloth  6 each Topical Daily  . heparin  5,000 Units Subcutaneous Q8H  . metoprolol  succinate  25 mg Oral Daily  . sertraline  50 mg Oral Daily  . sodium chloride flush  3 mL Intravenous Q12H  . ticagrelor  90 mg Oral BID    Assessment: Patient called a CODE STEMI taken to emergent cath s/p PCI and was started on tirofiban at around 0207. Cardiology consulted to continue tirofiban for another 4 hours.  Plan:  Order placed for tirofiban 0.15 mcg/kg/min (per CrCl > 60 ml/min). Tirofiban to stop 0744 at 12/29. Will continue to monitor and ensure drip is stopped at 0744.  Tobie Lords, PharmD, BCPS Clinical Pharmacist 04/20/2019,3:39 AM

## 2019-04-20 NOTE — ED Notes (Signed)
Cardiologist at pt bedside °

## 2019-04-20 NOTE — Progress Notes (Signed)
eLink Physician-Brief Progress Note Patient Name: Dennis Peck DOB: 1956/04/15 MRN: [id number]   Date of Service  04/20/2019  HPI/Events of Note  PT admitted for possible acute coronary syndrome and went to the cath lab.  eICU Interventions  New patient evaluation completed.        Kerry Kass Skipper Dacosta 04/20/2019, 3:41 AM

## 2019-04-20 NOTE — Progress Notes (Signed)
   04/20/19 0100  Clinical Encounter Type  Visited With Patient and family together  Visit Type Initial  Referral From Nurse  Consult/Referral To [place] (Comment)  Harrison received page at [phone] for a Code Stemi in ED05. Pt was alert and awake upon arrival. Expressed thoughts freely and displayed sense of humor. Shared that wife was in family room. Visited wife and built rapport with her. No further needs were expressed at this time.

## 2019-04-20 NOTE — Progress Notes (Signed)
Per Dr Nehemiah Massed stopped Nitro Drip. Made aware of troponin level. At this time no additional orders. MD rounded on patient this am, cath possible for tomorrow morning.

## 2019-04-20 NOTE — H&P (Signed)
History and Physical    Dennis Peck [id number] DOB: [birth year] DOA: 04/20/2019  PCP: Rusty Aus, MD  Patient coming from: home to cath lab  I have personally briefly reviewed patient's old medical records in [place]  Chief Complaint: chest pain s/p cath  HPI: Dennis Peck is a 63 y.o. male with medical history significant for hypertension and hyperlipidemia who presented to the emergency room with chest pain and found to have an anterior STEMI for which she was immediately taken to the Cath Lab undergoing DES placement in the small to mid LAD by Dr. Saunders Revel.  Is also found to have a 90 to 95% right posterior descending occlusion which will be done in a staged PCI per Dr. Saunders Revel.  Was admitted to the IC U post-cath.  Patient initially presented with a 3-week history of waxing and waning chest pain was scheduled for outpatient cardiac cath but developed acute worsening of his pain on the night of arrival.  Patient remains with mild chest discomfort but appears comfortable and stable in the ICU.  Patient denies shortness of breath has no nausea vomiting or diaphoresis    Review of Systems: As per HPI otherwise 10 point review of systems negative.    Past Medical History:  Diagnosis Date  . Hypertension     Past Surgical History:  Procedure Laterality Date  . BACK SURGERY    . COLONOSCOPY N/A 10/17/2014   Procedure: COLONOSCOPY;  Surgeon: Josefine Class, MD;  Location: [facility] ENDOSCOPY;  Service: Endoscopy;  Laterality: N/A;     reports that he has never smoked. His smokeless tobacco use includes chew. He reports current alcohol use of about 28.0 standard drinks of alcohol per week. He reports that he does not use drugs.  No Known Allergies  No family history on file.   Prior to Admission medications   Medication Sig Start Date End Date Taking? Authorizing Provider  etodolac (LODINE) 400 MG tablet Take 400 mg by mouth 2 (two) times daily.    [provider]  geriatric multivitamins-minerals (ELDERTONIC/GEVRABON) ELIX Take 15 mLs by mouth daily.    [provider]  glucosamine-chondroitin 500-400 MG tablet Take 1 tablet by mouth 3 (three) times daily.    [provider]  hydrochlorothiazide (HYDRODIURIL) 50 MG tablet Take 50 mg by mouth daily.    [provider]  metoprolol succinate (TOPROL-XL) 50 MG 24 hr tablet Take 50 mg by mouth daily. Take with or immediately following a meal.    [provider]  potassium chloride (K-DUR) 10 MEQ tablet Take 10 mEq by mouth daily.    [provider]  sertraline (ZOLOFT) 50 MG tablet Take 50 mg by mouth daily.    [provider]  Specialty Vitamins Products (MAGNESIUM, AMINO ACID CHELATE,) 133 MG tablet Take 1 tablet by mouth 2 (two) times daily.    [provider]  telmisartan (MICARDIS) 80 MG tablet Take 80 mg by mouth daily.    [provider]    Physical Exam: Vitals:   04/20/19 0108 04/20/19 0118 04/20/19 0300 04/20/19 0330  BP: 138/81   122/72  Pulse: 82   75  Resp: 18   12  Temp:    99 F (37.2 C)  TempSrc:    Oral  SpO2: 99% 97%  98%  Weight:    105.6 kg  Height:   5' 9.02" (1.753 m) 5\' 9"  (1.753 m)     Vitals:  04/20/19 0108 04/20/19 0118 04/20/19 0300 04/20/19 0330  BP: 138/81   122/72  Pulse: 82   75  Resp: 18   12  Temp:    99 F (37.2 C)  TempSrc:    Oral  SpO2: 99% 97%  98%  Weight:    105.6 kg  Height:   5' 9.02" (1.753 m) 5\' 9"  (1.753 m)    Constitutional: NAD, alert and oriented x 3 Eyes: PERRL, lids and conjunctivae normal ENMT: Mucous membranes are moist.  Neck: normal, supple, no masses, no thyromegaly Respiratory: clear to auscultation bilaterally, no wheezing, no crackles. Normal respiratory effort. No accessory muscle use.  Cardiovascular: Regular rate and rhythm, no murmurs / rubs / gallops. No extremity edema. 2+ pedal pulses. No carotid bruits.  Abdomen: no tenderness, no  masses palpated. No hepatosplenomegaly. Bowel sounds positive.  Musculoskeletal: no clubbing / cyanosis. No joint deformity upper and lower extremities.  Skin: no rashes, lesions, ulcers.  Neurologic: No gross focal neurologic deficit. Psychiatric: Normal mood and affect.   Labs on Admission: I have personally reviewed following labs and imaging studies  CBC: Recent Labs  Lab 04/20/19 0021  WBC 9.6  NEUTROABS 6.0  HGB 13.6  HCT 36.7*  MCV 80.3  PLT [id number]   Basic Metabolic Panel: Recent Labs  Lab 04/20/19 0021  NA 136  K 3.4*  CL 99  CO2 24  GLUCOSE 179*  BUN 34*  CREATININE 1.53*  CALCIUM 9.5   GFR: Estimated Creatinine Clearance: 60 mL/min (A) (by C-G formula based on SCr of 1.53 mg/dL (H)). Liver Function Tests: Recent Labs  Lab 04/20/19 0021  AST 64*  ALT 82*  ALKPHOS 70  BILITOT 0.8  PROT 7.0  ALBUMIN 4.4   No results for input(s): LIPASE, AMYLASE in the last 168 hours. No results for input(s): AMMONIA in the last 168 hours. Coagulation Profile: Recent Labs  Lab 04/20/19 0021  INR 1.0   Cardiac Enzymes: No results for input(s): CKTOTAL, CKMB, CKMBINDEX, TROPONINI in the last 168 hours. BNP (last 3 results) No results for input(s): PROBNP in the last 8760 hours. HbA1C: No results for input(s): HGBA1C in the last 72 hours. CBG: No results for input(s): GLUCAP in the last 168 hours. Lipid Profile: Recent Labs    04/20/19 0021  CHOL 302*  HDL 39*  LDLCALC UNABLE TO CALCULATE IF TRIGLYCERIDE OVER 400 mg/dL  TRIG 832*  CHOLHDL 7.7   Thyroid Function Tests: No results for input(s): TSH, T4TOTAL, FREET4, T3FREE, THYROIDAB in the last 72 hours. Anemia Panel: No results for input(s): VITAMINB12, FOLATE, FERRITIN, TIBC, IRON, RETICCTPCT in the last 72 hours. Urine analysis: No results found for: COLORURINE, APPEARANCEUR, LABSPEC, [place], GLUCOSEU, [place], BILIRUBINUR, KETONESUR, PROTEINUR, UROBILINOGEN, NITRITE, LEUKOCYTESUR  Radiological Exams on  Admission: CARDIAC CATHETERIZATION  Result Date: 04/20/2019 Conclusions: 1. Severe two-vessel coronary artery disease with thrombotic occlusion of the proximal/mid LAD involving the ostium of D1, as well as 95% stenosis of moderate to large caliber rPDA. 2. Mildly elevated left ventricular filling pressure. 3. Challenging but ultimately successful PCI to the proximal/mid LAD using Resolute Onyx 2.75 x 38 mm drug-eluting stent (post-dilated up to 3.6 mm proximally) with 0% residual stenosis and TIMI-3 flow.  Jailed D1 branch shows stable 70-80% ostial stenosis with TIMI-3 flow. 4. Difficult engagement of the left coronary artery requiring multiple guide catheters and significant contrast use.  Door-to-balloon time was delayed due to challenging anatomy.  Consider alternative access for future catheterizations. Recommendations: 1. Dual antiplatelet therapy with  aspirin and ticagrelor for at least 12 months. 2. Continue tirofiban infusion for 4 hours. 3. If patient has continued chest pain, consider adding NTG infusion for antianginal therapy. 4. Aggressive secondary prevention, including high-intensity statin therapy. 5. Obtain transthoracic echocardiogram. 6. Anticipate staged PCI to rPDA.  Recommend deferring this at least 2 days, if possible, given significant contrast use during today's case. Nelva Bush, MD [facility]      Assessment/Plan Principal Problem:    STEMI involving left anterior descending coronary artery [facility]) --s/p cath with DES to LAD lesioin --Orders per Dr. Saunders Revel --Currently on aspirin, atorvastatin, metoprolol brilenta. Prn NTG and morphine --Cardiology consult with Dr. Nehemiah Massed, per Dr. Saunders Revel  Essential hypertension --As needed hydralazine and labetalol per Dr. Saunders Revel      DVT prophylaxis: scds Code Status: full  Family Communication: wife, Ioane Constanza at bedside  Disposition Plan: Back to previous home environment Consults called: Dr Samul Dada MD [facility]     04/20/2019, 4:01 AM

## 2019-04-20 NOTE — Progress Notes (Signed)
Sent message to Dr Nehemiah Massed regarding patients troponin level of 91,864.

## 2019-04-20 NOTE — Progress Notes (Signed)
*  PRELIMINARY RESULTS* Echocardiogram 2D Echocardiogram has been performed.  Sherrie Sport 04/20/2019, 2:55 PM

## 2019-04-20 NOTE — ED Notes (Signed)
Pt departs for cath lab

## 2019-04-20 NOTE — ED Triage Notes (Signed)
Patient c/o chest and left arm pain. Patient seen by Nehemiah Massed today, and is supposed to have a cath on Wednesday.

## 2019-04-20 NOTE — Consult Note (Signed)
Cardiology Consultation:   Patient ID: Dennis Peck MRN: [id number]; DOB: [birth year]  Admit date: 04/20/2019 Date of Consult: 04/20/2019  Primary Care Provider: Rusty Aus, MD Primary Cardiologist: Serafina Royals, MD Primary Electrophysiologist:  None    Patient Profile:   Dennis Peck is a 63 y.o. male with a hx of hypertension who is being seen today for the evaluation of chest pain and abnormal EKG at the request of Dr. Owens Shark.  History of Present Illness:   Dennis Peck reports a 3 week history of fluctuating substernal chest pain often radiating to the left arm.  It comes and goes in intensity and at times is associated with nausea and dyspepsia.  Discomfort is sometimes associated with both eating and activity.  Tonight, the discomfort seemed more pronounced, particularly in his left arm, prompting him to come to the emergency department.  He was found to have anteroseptal ST segment elevation, prompting initiation of STEMI alert.  He notes that pain improved slightly at home with sublingual nitroglycerin and further improved with IV morphine in the ED.  The patient also received aspirin and IV heparin prior to being taken to the Cath Lab.  At that time, he continued to complain of 3-4/10 chest pain as well as mild nausea.  Dennis Peck denies a history of cardiac disease.  He was seen by Dr. Nehemiah Massed in cardiology clinic yesterday for assessment of chest pain and had been scheduled for outpatient catheterization later this week.  Emergent cardiac catheterization revealed thrombotic occlusion of the proximal/mid LAD, with significant stenosis also involving the ostium of D1.  There was also a 95% lesion involving the RPDA.  The patient underwent successful primary PCI to the proximal and mid LAD with restoration of TIMI-3 flow.  He continues to have mild (1-2/10) chest discomfort.  Heart Pathway Score:     Past Medical History:  Diagnosis Date  . Hypertension      Past Surgical History:  Procedure Laterality Date  . BACK SURGERY    . COLONOSCOPY N/A 10/17/2014   Procedure: COLONOSCOPY;  Surgeon: Josefine Class, MD;  Location: [facility] ENDOSCOPY;  Service: Endoscopy;  Laterality: N/A;     Home Medications:  Prior to Admission medications   Medication Sig Start Date Alvin Rubano Date Taking? Authorizing Provider  etodolac (LODINE) 400 MG tablet Take 400 mg by mouth 2 (two) times daily.    [provider]  geriatric multivitamins-minerals (ELDERTONIC/GEVRABON) ELIX Take 15 mLs by mouth daily.    [provider]  glucosamine-chondroitin 500-400 MG tablet Take 1 tablet by mouth 3 (three) times daily.    [provider]  hydrochlorothiazide (HYDRODIURIL) 50 MG tablet Take 50 mg by mouth daily.    [provider]  metoprolol succinate (TOPROL-XL) 50 MG 24 hr tablet Take 50 mg by mouth daily. Take with or immediately following a meal.    [provider]  potassium chloride (K-DUR) 10 MEQ tablet Take 10 mEq by mouth daily.    [provider]  sertraline (ZOLOFT) 50 MG tablet Take 50 mg by mouth daily.    [provider]  Specialty Vitamins Products (MAGNESIUM, AMINO ACID CHELATE,) 133 MG tablet Take 1 tablet by mouth 2 (two) times daily.    [provider]  telmisartan (MICARDIS) 80 MG tablet Take 80 mg by mouth daily.    [provider]    Inpatient Medications: Scheduled Meds: . aspirin  81 mg Oral Daily  . atorvastatin  80 mg  Oral q1800  . Chlorhexidine Gluconate Cloth  6 each Topical Daily  . heparin  5,000 Units Subcutaneous Q8H  . metoprolol succinate  25 mg Oral Daily  . sertraline  50 mg Oral Daily  . sodium chloride flush  3 mL Intravenous Q12H  . ticagrelor  90 mg Oral BID   Continuous Infusions: . sodium chloride 20 mL/hr (04/20/19 0056)  . sodium chloride 50 mL/hr at 04/20/19 0330  . sodium chloride    . nitroGLYCERIN 5 mcg/min (04/20/19 0337)  . tirofiban      PRN Meds:   Allergies:   No Known Allergies  Social History:   Social History   Tobacco Use  . Smoking status: Never Smoker  . Smokeless tobacco: Current User    Types: Chew  Substance Use Topics  . Alcohol use: Yes    Alcohol/week: 28.0 standard drinks    Types: 28 Glasses of wine per week  . Drug use: No     Family History:   [name] has history of coronary artery disease.  Mother has history of diabetes and hypertension.  ROS:  Review of Systems  Unable to perform ROS: Acuity of condition   Physical Exam/Data:   Vitals:   04/20/19 0030 04/20/19 0108 04/20/19 0118 04/20/19 0300  BP: 138/81 138/81    Pulse: 71 82    Resp: 20 18    Temp:      TempSrc:      SpO2: 100% 99% 97%   Weight:      Height:    5' 9.02" (1.753 m)   No intake or output data in the 24 hours ending 04/20/19 0339 Last 3 Weights 04/20/2019 10/17/2014  Weight (lbs) 240 lb 1.3 oz 240 lb  Weight (kg) 108.9 kg 108.863 kg     Body mass index is 35.44 kg/m.  General:  Well nourished, well developed, in no acute distress HEENT: normal Lymph: no adenopathy Neck: no JVD Endocrine:  No thryomegaly Vascular: No carotid bruits; 2+ radial and pedal pulses bilaterally. Cardiac:  normal S1, S2; RRR; no murmurs, rubs, or gallops. Lungs:  clear to auscultation bilaterally, no wheezing, rhonchi or rales  Abd: soft, nontender, no hepatomegaly  Ext: no edema Musculoskeletal:  No deformities, BUE and BLE strength normal and equal Skin: warm and dry  Neuro:  CNs 2-12 intact, no focal abnormalities noted Psych:  Normal affect   EKG:  The EKG was personally reviewed and demonstrates: Normal sinus rhythm with anteroseptal ST elevations of up to 2 mm and reciprocal depressions in the inferior leads. Telemetry:  Telemetry was personally reviewed and demonstrates: Normal sinus rhythm with occasional PVCs  Relevant CV Studies: LHC/PCI (04/20/2019): Diagnostic Dominance:  Right  Intervention     Laboratory Data:  High Sensitivity Troponin:   Recent Labs  Lab 04/20/19 0021  TROPONINIHS 532*     Chemistry Recent Labs  Lab 04/20/19 0021  NA 136  K 3.4*  CL 99  CO2 24  GLUCOSE 179*  BUN 34*  CREATININE 1.53*  CALCIUM 9.5  GFRNONAA 48*  GFRAA 56*  ANIONGAP 13    Recent Labs  Lab 04/20/19 0021  PROT 7.0  ALBUMIN 4.4  AST 64*  ALT 82*  ALKPHOS 70  BILITOT 0.8   Hematology Recent Labs  Lab 04/20/19 0021  WBC 9.6  RBC 4.57  HGB 13.6  HCT 36.7*  MCV 80.3  MCH 29.8  MCHC 37.1*  RDW 12.8  PLT 183   BNPNo results for input(s): BNP,  PROBNP in the last 168 hours.  DDimer No results for input(s): DDIMER in the last 168 hours.   Radiology/Studies:  CARDIAC CATHETERIZATION  Result Date: 04/20/2019 Conclusions: 1. Severe two-vessel coronary artery disease with thrombotic occlusion of the proximal/mid LAD involving the ostium of D1, as well as 95% stenosis of moderate to large caliber rPDA. 2. Mildly elevated left ventricular filling pressure. 3. Challenging but ultimately successful PCI to the proximal/mid LAD using Resolute Onyx 2.75 x 38 mm drug-eluting stent (post-dilated up to 3.6 mm proximally) with 0% residual stenosis and TIMI-3 flow.  Jailed D1 branch shows stable 70-80% ostial stenosis with TIMI-3 flow. 4. Difficult engagement of the left coronary artery requiring multiple guide catheters and significant contrast use.  Door-to-balloon time was delayed due to challenging anatomy.  Consider alternative access for future catheterizations. Recommendations: 1. Dual antiplatelet therapy with aspirin and ticagrelor for at least 12 months. 2. Continue tirofiban infusion for 4 hours. 3. If patient has continued chest pain, consider adding NTG infusion for antianginal therapy. 4. Aggressive secondary prevention, including high-intensity statin therapy. 5. Obtain transthoracic echocardiogram. 6. Anticipate staged PCI to rPDA.  Recommend  deferring this at least 2 days, if possible, given significant contrast use during today's case. Nelva Bush, MD [facility]   TIMI Risk Score for ST  Elevation MI:   The patient's TIMI risk score is 2, which indicates a 2.2% risk of all cause mortality at 30 days.    Assessment and Plan:   Anterior STEMI: Patient has experienced waxing and waning chest pain over the last 3 weeks, significantly worsened this evening.  EKG changes were concerning for anterior STEMI, with catheterization confirming occlusion of the proximal/mid LAD consistent with acute plaque rupture and thrombotic occlusion (type I MI).  The patient is now status post primary PCI with improvement but not complete resolution of his chest pain.  I suspect some residual pain is related to his infarct and possibly residual disease involving the RPDA.  It should be noted that catheterization was challenging due to difficult engagement of the left coronary artery requiring greater than typical contrast administration.  Admit to ICU.  Dual antiplatelet therapy with aspirin and ticagrelor for at least 12 months.  Continue tirofiban infusion for 4 hours.  Aggressive secondary prevention, including high intensity statin therapy.  I will repeat a lipid panel this morning, given marked hypertriglyceridemia on admission (question if this was fasting).  Obtain transthoracic echocardiogram to assess LVEF.  Trend troponin every 6 hours until it has peaked, then stop.  Decrease metoprolol succinate to 25 mg daily, given soft blood pressure during catheterization.  Check hemoglobin A1c.  Ischemic cardiomyopathy: LVEF not assessed during catheterization but likely reduced.  LVEDP was mildly elevated.  Obtain transthoracic echocardiogram.  Gentle post catheterization hydration given mild AKI and significant contrast exposure during catheterization/PCI.  Decrease metoprolol succinate to 25 mg daily given soft blood  pressure.  Hold ARB in the setting of mild AKI.  Recommend restarting telmisartan, as blood pressure and renal function tolerate.  Hyperlipidemia: Marked hypertriglyceridemia noted on lipid panel in the ED.  LDL could not be calculated.  Repeat fasting lipid panel at 6 AM.  If triglycerides still significantly elevated, direct LDL will need to be added.  Atorvastatin 80 mg daily for target LDL less than 70.  If triglycerides remain significantly elevated, consider addition of Vascepa as an outpatient.  Hypertension: Blood pressure low normal during catheterization.  Decrease metoprolol succinate to 25 mg daily.  Hold telmisartan.  Disposition: I will continue to follow the patient overnight and will transition care to Dr. Nehemiah Massed, Mr. Prom's primary cardiologist, in the morning.  For questions or updates, please contact [place] Please consult [email or web address] for contact info under [facility].  Signed, Nelva Bush, MD  04/20/2019 3:39 AM

## 2019-04-20 NOTE — Consult Note (Signed)
[facility] Cardiology Consultation Note  Patient ID: Dennis Peck, MRN: [id number], DOB/AGE: 62-Jun-1957 62 y.o. Admit date: 04/20/2019   Date of Consult: 04/20/2019 Primary Physician: Rusty Aus, MD Primary Cardiologist: Nehemiah Massed  Chief Complaint:  Chief Complaint  Patient presents with  . Code STEMI   Reason for Consult: Chest pain  HPI: 63 y.o. male with known hypertension hyperlipidemia with acute ST elevation myocardial infarction of anterior wall and left anterior descending artery is now status post PCI and stent placement of left anterior descending artery.  Patient does have residual significant stenosis of PDA which may need further intervention before hospitalization ends.  Patient ill still has slight amount of chest discomfort after ST elevation myocardial infarction from residual issues.  He has been placed on dual antiplatelet therapy and is tolerated well with high intensity cholesterol therapy metoprolol and will consider addition of ACE inhibitor thereafter.  Past Medical History:  Diagnosis Date  . Hypertension       Surgical History:  Past Surgical History:  Procedure Laterality Date  . BACK SURGERY    . COLONOSCOPY N/A 10/17/2014   Procedure: COLONOSCOPY;  Surgeon: Josefine Class, MD;  Location: [facility] ENDOSCOPY;  Service: Endoscopy;  Laterality: N/A;  . CORONARY/GRAFT ACUTE MI REVASCULARIZATION N/A 04/20/2019   Procedure: Coronary/Graft Acute MI Revascularization;  Surgeon: Nelva Bush, MD;  Location: [place] CV LAB;  Service: Cardiovascular;  Laterality: N/A;  . LEFT HEART CATH AND CORONARY ANGIOGRAPHY N/A 04/20/2019   Procedure: LEFT HEART CATH AND CORONARY ANGIOGRAPHY;  Surgeon: Nelva Bush, MD;  Location: [place] CV LAB;  Service: Cardiovascular;  Laterality: N/A;     Home Meds: Prior to Admission medications   Medication Sig Start Date End Date Taking? Authorizing Provider  etodolac (LODINE) 400 MG tablet Take 400  mg by mouth 2 (two) times daily.    [provider]  geriatric multivitamins-minerals (ELDERTONIC/GEVRABON) ELIX Take 15 mLs by mouth daily.    [provider]  glucosamine-chondroitin 500-400 MG tablet Take 1 tablet by mouth 3 (three) times daily.    [provider]  hydrochlorothiazide (HYDRODIURIL) 50 MG tablet Take 50 mg by mouth daily.    [provider]  metoprolol succinate (TOPROL-XL) 50 MG 24 hr tablet Take 50 mg by mouth daily. Take with or immediately following a meal.    [provider]  potassium chloride (K-DUR) 10 MEQ tablet Take 10 mEq by mouth daily.    [provider]  sertraline (ZOLOFT) 50 MG tablet Take 50 mg by mouth daily.    [provider]  Specialty Vitamins Products (MAGNESIUM, AMINO ACID CHELATE,) 133 MG tablet Take 1 tablet by mouth 2 (two) times daily.    [provider]  telmisartan (MICARDIS) 80 MG tablet Take 80 mg by mouth daily.    [provider]    Inpatient Medications:  . aspirin  81 mg Oral Daily  . atorvastatin  80 mg Oral q1800  . Chlorhexidine Gluconate Cloth  6 each Topical Daily  . heparin  5,000 Units Subcutaneous Q8H  . metoprolol succinate  25 mg Oral Daily  . sertraline  50 mg Oral Daily  . sodium chloride flush  3 mL Intravenous Q12H  . ticagrelor  90 mg Oral BID   . sodium chloride 20 mL/hr (04/20/19 0056)  . sodium chloride    . nitroGLYCERIN 10 mcg/min (04/20/19 0400)    Allergies: No Known Allergies  Social History   Socioeconomic History  .  Marital status: Married    Spouse name: Not on file  . Number of children: Not on file  . Years of education: Not on file  . Highest education level: Not on file  Occupational History  . Not on file  Tobacco Use  . Smoking status: Never Smoker  . Smokeless tobacco: Current User    Types: Chew  Substance and Sexual Activity  . Alcohol use: Yes    Alcohol/week: 28.0 standard drinks    Types: 28 Glasses  of wine per week  . Drug use: No  . Sexual activity: Not on file  Other Topics Concern  . Not on file  Social History Narrative  . Not on file   Social Determinants of Health   Financial Resource Strain:   . Difficulty of Paying Living Expenses: Not on file  Food Insecurity:   . Worried About Charity fundraiser in the Last Year: Not on file  . Ran Out of Food in the Last Year: Not on file  Transportation Needs:   . Lack of Transportation (Medical): Not on file  . Lack of Transportation (Non-Medical): Not on file  Physical Activity:   . Days of Exercise per Week: Not on file  . Minutes of Exercise per Session: Not on file  Stress:   . Feeling of Stress : Not on file  Social Connections:   . Frequency of Communication with Friends and Family: Not on file  . Frequency of Social Gatherings with Friends and Family: Not on file  . Attends Religious Services: Not on file  . Active Member of Clubs or Organizations: Not on file  . Attends Archivist Meetings: Not on file  . Marital Status: Not on file  Intimate Partner Violence:   . Fear of Current or Ex-Partner: Not on file  . Emotionally Abused: Not on file  . Physically Abused: Not on file  . Sexually Abused: Not on file     No family history on file.   Review of Systems Positive for chest pain Negative for: General:  chills, fever, night sweats or weight changes.  Cardiovascular: PND orthopnea syncope dizziness  Dermatological skin lesions rashes Respiratory: Cough congestion Urologic: Frequent urination urination at night and hematuria Abdominal: negative for nausea, vomiting, diarrhea, bright red blood per rectum, melena, or hematemesis Neurologic: negative for visual changes, and/or hearing changes  All other systems reviewed and are otherwise negative except as noted above.  Labs: No results for input(s): CKTOTAL, CKMB, TROPONINI in the last 72 hours. Lab Results  Component Value Date   WBC 9.6  04/20/2019   HGB 12.8 (L) 04/20/2019   HCT 34.4 (L) 04/20/2019   MCV 80.2 04/20/2019   PLT 181 04/20/2019    Recent Labs  Lab 04/20/19 0021 04/20/19 0340  NA 136 136  K 3.4* 3.5  CL 99 100  CO2 24 24  BUN 34* 34*  CREATININE 1.53* 1.37*  CALCIUM 9.5 9.0  PROT 7.0  --   BILITOT 0.8  --   ALKPHOS 70  --   ALT 82*  --   AST 64*  --   GLUCOSE 179* 173*   Lab Results  Component Value Date   CHOL 283 (H) 04/20/2019   HDL 40 (L) 04/20/2019   LDLCALC UNABLE TO CALCULATE IF TRIGLYCERIDE OVER 400 mg/dL 04/20/2019   TRIG 412 (H) 04/20/2019   No results found for: DDIMER  Radiology/Studies:  CARDIAC CATHETERIZATION  Result Date: 04/20/2019 Conclusions: 1. Severe two-vessel coronary  artery disease with thrombotic occlusion of the proximal/mid LAD involving the ostium of D1, as well as 95% stenosis of moderate to large caliber rPDA. 2. Mildly elevated left ventricular filling pressure. 3. Challenging but ultimately successful PCI to the proximal/mid LAD using Resolute Onyx 2.75 x 38 mm drug-eluting stent (post-dilated up to 3.6 mm proximally) with 0% residual stenosis and TIMI-3 flow.  Jailed D1 branch shows stable 70-80% ostial stenosis with TIMI-3 flow. 4. Difficult engagement of the left coronary artery requiring multiple guide catheters and significant contrast use.  Door-to-balloon time was delayed due to challenging anatomy.  Consider alternative access for future catheterizations. Recommendations: 1. Dual antiplatelet therapy with aspirin and ticagrelor for at least 12 months. 2. Continue tirofiban infusion for 4 hours. 3. If patient has continued chest pain, consider adding NTG infusion for antianginal therapy. 4. Aggressive secondary prevention, including high-intensity statin therapy. 5. Obtain transthoracic echocardiogram. 6. Anticipate staged PCI to rPDA.  Recommend deferring this at least 2 days, if possible, given significant contrast use during today's case. Nelva Bush, MD  [facility]    EKG: Normal sinus rhythm  Weights: Filed Weights   04/20/19 0027 04/20/19 0330  Weight: 108.9 kg 105.6 kg     Physical Exam: Blood pressure 111/70, pulse 83, temperature 98.3 F (36.8 C), resp. rate 16, height 5\' 9"  (1.753 m), weight 105.6 kg, SpO2 97 %. Body mass index is 34.38 kg/m. General: Well developed, well nourished, in no acute distress. Head eyes ears nose throat: Normocephalic, atraumatic, sclera non-icteric, no xanthomas, nares are without discharge. No apparent thyromegaly and/or mass  Lungs: Normal respiratory effort.  no wheezes, no rales, no rhonchi.  Heart: RRR with normal S1 S2. no murmur gallop, no rub, PMI is normal size and placement, carotid upstroke normal without bruit, jugular venous pressure is normal Abdomen: Soft, non-tender, non-distended with normoactive bowel sounds. No hepatomegaly. No rebound/guarding. No obvious abdominal masses. Abdominal aorta is normal size without bruit Extremities: No edema. no cyanosis, no clubbing, no ulcers  Peripheral : 2+ bilateral upper extremity pulses, 2+ bilateral femoral pulses, 2+ bilateral dorsal pedal pulse Neuro: Alert and oriented. No facial asymmetry. No focal deficit. Moves all extremities spontaneously. Musculoskeletal: Normal muscle tone without kyphosis Psych:  Responds to questions appropriately with a normal affect.    Assessment: 63 year old male with hypertension hyperlipidemia with anterior myocardial infarction with some residual chest discomfort  Plan: 1.  Continue dual antiplatelet therapy for anterior myocardial infarction 2.  High intensity cholesterol therapy 3.  Echocardiogram for LV systolic dysfunction valvular heart disease contributing to above 4.  Continue metoprolol and possibly add spironolactone and/or ACE inhibitor tomorrow if necessary 5.  Further consideration of additional treatment with distal PDA stenosis if necessary tomorrow  Signed, Corey Skains M.D.  [facility] 04/20/2019, 8:00 AM

## 2019-04-20 NOTE — Progress Notes (Signed)
[place] [place] [facility] [place] NAME: Dennis Peck    MR#:  [id number]  DATE OF BIRTH:  03/12/1956  SUBJECTIVE:  CHIEF COMPLAINT:   Chief Complaint  Patient presents with  . Code STEMI  minimal chest pressure, no new issues REVIEW OF SYSTEMS:  Review of Systems  Constitutional: Negative for diaphoresis, fever, malaise/fatigue and weight loss.  HENT: Negative for ear discharge, ear pain, hearing loss, nosebleeds, sore throat and tinnitus.   Eyes: Negative for blurred vision and pain.  Respiratory: Negative for cough, hemoptysis, shortness of breath and wheezing.   Cardiovascular: Positive for chest pain. Negative for palpitations, orthopnea and leg swelling.  Gastrointestinal: Negative for abdominal pain, blood in stool, constipation, diarrhea, heartburn, nausea and vomiting.  Genitourinary: Negative for dysuria, frequency and urgency.  Musculoskeletal: Negative for back pain and myalgias.  Skin: Negative for itching and rash.  Neurological: Negative for dizziness, tingling, tremors, focal weakness, seizures, weakness and headaches.  Psychiatric/Behavioral: Negative for depression. The patient is not nervous/anxious.     DRUG ALLERGIES:  No Known Allergies VITALS:  Blood pressure 122/88, pulse 83, temperature 99.9 F (37.7 C), temperature source Oral, resp. rate (!) 21, height 5\' 9"  (1.753 m), weight 105.6 kg, SpO2 98 %. PHYSICAL EXAMINATION:  Physical Exam HENT:     Head: Normocephalic and atraumatic.  Eyes:     Conjunctiva/sclera: Conjunctivae normal.     Pupils: Pupils are equal, round, and reactive to light.  Neck:     Thyroid: No thyromegaly.     Trachea: No tracheal deviation.  Cardiovascular:     Rate and Rhythm: Normal rate and regular rhythm.     Heart sounds: Normal heart sounds.  Pulmonary:     Effort: Pulmonary effort is normal. No respiratory distress.     Breath sounds: Normal breath sounds. No wheezing.  Chest:     Chest wall: No  tenderness.  Abdominal:     General: Bowel sounds are normal. There is no distension.     Palpations: Abdomen is soft.     Tenderness: There is no abdominal tenderness.  Musculoskeletal:        General: Normal range of motion.     Cervical back: Normal range of motion and neck supple.  Skin:    General: Skin is warm and dry.     Findings: No rash.  Neurological:     Mental Status: He is alert and oriented to person, place, and time.     Cranial Nerves: No cranial nerve deficit.    LABORATORY PANEL:  Male CBC Recent Labs  Lab 04/20/19 0340  WBC 9.6  HGB 12.8*  HCT 34.4*  PLT 181   ------------------------------------------------------------------------------------------------------------------ Chemistries  Recent Labs  Lab 04/20/19 0021 04/20/19 0340  NA 136 136  K 3.4* 3.5  CL 99 100  CO2 24 24  GLUCOSE 179* 173*  BUN 34* 34*  CREATININE 1.53* 1.37*  CALCIUM 9.5 9.0  AST 64*  --   ALT 82*  --   ALKPHOS 70  --   BILITOT 0.8  --    RADIOLOGY:  CARDIAC CATHETERIZATION  Result Date: 04/20/2019 Conclusions: 1. Severe two-vessel coronary artery disease with thrombotic occlusion of the proximal/mid LAD involving the ostium of D1, as well as 95% stenosis of moderate to large caliber rPDA. 2. Mildly elevated left ventricular filling pressure. 3. Challenging but ultimately successful PCI to the proximal/mid LAD using Resolute Onyx 2.75 x 38 mm drug-eluting stent (post-dilated up to 3.6 mm proximally)  with 0% residual stenosis and TIMI-3 flow.  Jailed D1 branch shows stable 70-80% ostial stenosis with TIMI-3 flow. 4. Difficult engagement of the left coronary artery requiring multiple guide catheters and significant contrast use.  Door-to-balloon time was delayed due to challenging anatomy.  Consider alternative access for future catheterizations. Recommendations: 1. Dual antiplatelet therapy with aspirin and ticagrelor for at least 12 months. 2. Continue tirofiban infusion for  4 hours. 3. If patient has continued chest pain, consider adding NTG infusion for antianginal therapy. 4. Aggressive secondary prevention, including high-intensity statin therapy. 5. Obtain transthoracic echocardiogram. 6. Anticipate staged PCI to rPDA.  Recommend deferring this at least 2 days, if possible, given significant contrast use during today's case. Nelva Bush, MD [facility]   ASSESSMENT AND PLAN:  63 year old male with hypertension hyperlipidemia with anterior myocardial infarction with some residual chest discomfort  STEMI involving left anterior descending coronary artery [facility]) --s/p cath with DES to LAD lesioin --Continue aspirin, atorvastatin, metoprolol, brilenta. Prn NTG and morphine - continue High intensity cholesterol therapy - Echocardiogram pending - continue spironolactone and/or ACE inhibitor tomorrow if necessary per cardio - Further consideration of additional treatment for distal PDA stenosis per cardio if necessary tomorrow   Essential hypertension --metoprolol        All the records are reviewed and case discussed with Care Management/Social Worker. Management plans discussed with the patient, nursing and they are in agreement.  CODE STATUS: Full Code  TOTAL TIME TAKING CARE OF THIS PATIENT: 25 minutes.   More than 50% of the time was spent in counseling/coordination of care: YES  POSSIBLE D/C IN 2-3 DAYS, DEPENDING ON CLINICAL CONDITION. And cardio eval   Max Sane M.D on 04/20/2019 at 8:16 PM  Between 7am to 6pm - Pager - [phone]  After 6pm go to [email or web address] - password [email or web address]  [facility]   CC: Primary care physician; Rusty Aus, MD  Note: This dictation was prepared with Dragon dictation along with smaller phrase technology. Any transcriptional errors that result from this process are unintentional.

## 2019-04-20 NOTE — ED Provider Notes (Signed)
[facility] Emergency Department Provider Note   First MD Initiated Contact with Patient 04/20/19 0034     (approximate)  I have reviewed the triage vital signs and the nursing notes.   HISTORY  Chief Complaint Code STEMI    HPI Dennis Peck is a 63 y.o. male with history of hypertension recent cardiac evaluation by Dr. Nehemiah Massed yesterday secondary to chest pain with plan for cardiac catheterization presents to the emergency department secondary to worsening chest pain with radiation to the left arm tonight.  Patient states that current pain score is 7 out of 10.  Patient denies any dyspnea no nausea or vomiting.  Patient denies any diaphoresis.        Past Medical History:  Diagnosis Date  . Hypertension     There are no problems to display for this patient.   Past Surgical History:  Procedure Laterality Date  . BACK SURGERY    . COLONOSCOPY N/A 10/17/2014   Procedure: COLONOSCOPY;  Surgeon: Josefine Class, MD;  Location: [facility] ENDOSCOPY;  Service: Endoscopy;  Laterality: N/A;    Prior to Admission medications   Medication Sig Start Date End Date Taking? Authorizing Provider  etodolac (LODINE) 400 MG tablet Take 400 mg by mouth 2 (two) times daily.    [provider]  geriatric multivitamins-minerals (ELDERTONIC/GEVRABON) ELIX Take 15 mLs by mouth daily.    [provider]  glucosamine-chondroitin 500-400 MG tablet Take 1 tablet by mouth 3 (three) times daily.    [provider]  hydrochlorothiazide (HYDRODIURIL) 50 MG tablet Take 50 mg by mouth daily.    [provider]  metoprolol succinate (TOPROL-XL) 50 MG 24 hr tablet Take 50 mg by mouth daily. Take with or immediately following a meal.    [provider]  potassium chloride (K-DUR) 10 MEQ tablet Take 10 mEq by mouth daily.    [provider]  sertraline (ZOLOFT) 50 MG tablet Take 50 mg by mouth daily.    [provider]  Specialty Vitamins Products (MAGNESIUM, AMINO ACID CHELATE,) 133 MG tablet Take 1 tablet by mouth 2 (two) times daily.    [provider]  telmisartan (MICARDIS) 80 MG tablet Take 80 mg by mouth daily.    [provider]    Allergies Patient has no known allergies.  No family history on file.  Social History Social History   Tobacco Use  . Smoking status: Never Smoker  . Smokeless tobacco: Current User    Types: Chew  Substance Use Topics  . Alcohol use: Yes    Alcohol/week: 28.0 standard drinks    Types: 28 Glasses of wine per week  . Drug use: No    Review of Systems Constitutional: No fever/chills Eyes: No visual changes. ENT: No sore throat. Cardiovascular: Positive for chest pain. Respiratory: Denies shortness of breath. Gastrointestinal: No abdominal pain.  No nausea, no vomiting.  No diarrhea.  No constipation. Genitourinary: Negative for dysuria. Musculoskeletal: Negative for neck pain.  Negative for back pain. Integumentary: Negative for rash. Neurological: Negative for headaches, focal weakness or numbness.  ____________________________________________   PHYSICAL EXAM:  VITAL SIGNS: ED Triage Vitals  Enc Vitals Group     BP 04/20/19 0024 124/78     Pulse Rate 04/20/19 0024 72     Resp 04/20/19 0024 18     Temp 04/20/19 0024 98.7 F (37.1 C)     Temp Source 04/20/19 0024 Oral     SpO2 04/20/19  0024 99 %     Weight 04/20/19 0027 108.9 kg (240 lb 1.3 oz)     Height --      Head Circumference --      Peak Flow --      Pain Score --      Pain Loc --      Pain Edu? --      Excl. in [place]? --      Constitutional: Alert and oriented.  Eyes: Conjunctivae are normal.  Mouth/Throat: Patient is wearing a mask. Neck: No stridor.  No meningeal signs.   Cardiovascular: Normal rate, regular rhythm. Good peripheral circulation. Grossly normal heart sounds. Respiratory: Normal respiratory effort.  No retractions. Gastrointestinal: Soft  and nontender. No distention.  Musculoskeletal: No lower extremity tenderness nor edema. No gross deformities of extremities. Neurologic:  Normal speech and language. No gross focal neurologic deficits are appreciated.  Skin:  Skin is warm, dry and intact. Psychiatric: Mood and affect are normal. Speech and behavior are normal.  ____________________________________________   LABS (all labs ordered are listed, but only abnormal results are displayed)  Labs Reviewed  CBC WITH DIFFERENTIAL/PLATELET - Abnormal; Notable for the following components:      Result Value   HCT 36.7 (*)    MCHC 37.1 (*)    Abs Immature Granulocytes 0.12 (*)    All other components within normal limits  PROTIME-INR  APTT  COMPREHENSIVE METABOLIC PANEL  LIPID PANEL  TROPONIN I (HIGH SENSITIVITY)   ____________________________________________  EKG  ED ECG REPORT I, [place] N Jaydynn Wolford, the attending physician, personally viewed and interpreted this ECG.   Date: 04/20/2019  EKG Time: 12:21 AM  Rate: 69  Rhythm: Normal sinus rhythm  Axis: Normal  Intervals: Normal  ST&T Change: ST segment elevation V1 V2 V3 greater than 2 mm with ST segment depressions 2 3 and aVF.  ED ECG REPORT I, [place] N Malakhai Beitler, the attending physician, personally viewed and interpreted this ECG.   Date: 04/20/2019  EKG Time: 12:32 AM  Rate: 66  Rhythm: Normal sinus rhythm  Axis: Normal  Intervals: Normal  ST&T Change: T-segment elevation V1 V2 V3 with reciprocal depression to 3 and aVF. ______________________   PROCEDURES    .Critical Care Performed by: Gregor Hams, MD Authorized by: Gregor Hams, MD   Critical care provider statement:    Critical care time (minutes):  30   Critical care time was exclusive of:  Separately billable procedures and treating other patients   Critical care was necessary to treat or prevent imminent or life-threatening deterioration of the following conditions:  Circulatory  failure   Critical care was time spent personally by me on the following activities:  Development of treatment plan with patient or surrogate, discussions with consultants, evaluation of patient's response to treatment, examination of patient, obtaining history from patient or surrogate, ordering and performing treatments and interventions, ordering and review of laboratory studies, ordering and review of radiographic studies, pulse oximetry, re-evaluation of patient's condition and review of old charts     ____________________________________________   INITIAL IMPRESSION / MDM / [place] / ED COURSE  As part of my medical decision making, I reviewed the following data within the electronic MEDICAL RECORD NUMBER  63 year old male presented with above-stated history and physical exam concerning for anterior myocardial infarction with reciprocal inferior changes.  As such code STEMI activated.  Patient discussed with Dr. Saunders Revel cardiologist on-call who presented to the emergency department evaluated patient and took the  patient to the Cath Lab.  Patient was given 4000 units of heparin IV in the emergency department as well as 324 mg of aspirin p.o.   ____________________________________________  FINAL CLINICAL IMPRESSION(S) / ED DIAGNOSES  Final diagnoses:  ST elevation myocardial infarction involving left anterior descending (LAD) coronary artery (HCC)     MEDICATIONS GIVEN DURING THIS VISIT:  Medications  0.9 %  sodium chloride infusion (20 mL/hr Intravenous New Bag/Given 04/20/19 0056)  aspirin chewable tablet 324 mg (324 mg Oral Given 04/20/19 0034)  morphine 2 MG/ML injection 2 mg (2 mg Intravenous Given 04/20/19 0049)  morphine 2 MG/ML injection 2 mg (2 mg Intravenous Given 04/20/19 0048)     ED Discharge Orders         Ordered    predniSONE (DELTASONE) 20 MG tablet  Daily,   Status:  Discontinued     04/20/19 0047    cyclopentolate (CYCLODRYL,CYCLOGYL) 1 % ophthalmic  solution  3 times daily PRN,   Status:  Discontinued     04/20/19 0047          *Please note:  Dennis Peck was evaluated in Emergency Department on 04/20/2019 for the symptoms described in the history of present illness. He was evaluated in the context of the global COVID-19 pandemic, which necessitated consideration that the patient might be at risk for infection with the SARS-CoV-2 virus that causes COVID-19. Institutional protocols and algorithms that pertain to the evaluation of patients at risk for COVID-19 are in a state of rapid change based on information released by regulatory bodies including the CDC and federal and state organizations. These policies and algorithms were followed during the patient's care in the ED.  Some ED evaluations and interventions may be delayed as a result of limited staffing during the pandemic.*  Note:  This document was prepared using Dragon voice recognition software and may include unintentional dictation errors.   Gregor Hams, MD 04/20/19 [phone]

## 2019-04-21 DIAGNOSIS — I10 Essential (primary) hypertension: Secondary | ICD-10-CM

## 2019-04-21 LAB — CBC
HCT: 35.6 % — ABNORMAL LOW (ref 39.0–52.0)
Hemoglobin: 12.5 g/dL — ABNORMAL LOW (ref 13.0–17.0)
MCH: 29.6 pg (ref 26.0–34.0)
MCHC: 35.1 g/dL (ref 30.0–36.0)
MCV: 84.2 fL (ref 80.0–100.0)
Platelets: 150 10*3/uL (ref 150–400)
RBC: 4.23 MIL/uL (ref 4.22–5.81)
RDW: 12.6 % (ref 11.5–15.5)
WBC: 7.1 10*3/uL (ref 4.0–10.5)
nRBC: 0 % (ref 0.0–0.2)

## 2019-04-21 LAB — BASIC METABOLIC PANEL
Anion gap: 10 (ref 5–15)
BUN: 20 mg/dL (ref 8–23)
CO2: 25 mmol/L (ref 22–32)
Calcium: 9 mg/dL (ref 8.9–10.3)
Chloride: 102 mmol/L (ref 98–111)
Creatinine, Ser: 1.04 mg/dL (ref 0.61–1.24)
GFR calc Af Amer: >60 mL/min (ref >60)
GFR calc non Af Amer: >60 mL/min (ref >60)
Glucose, Bld: 178 mg/dL — ABNORMAL HIGH (ref 70–99)
Potassium: 3.3 mmol/L — ABNORMAL LOW (ref 3.5–5.1)
Sodium: 137 mmol/L (ref 135–145)

## 2019-04-21 LAB — GLUCOSE, CAPILLARY: Glucose-Capillary: 194 mg/dL — ABNORMAL HIGH (ref 70–99)

## 2019-04-21 LAB — ECHOCARDIOGRAM COMPLETE
Height: 69 in
Weight: 3724.89 oz

## 2019-04-21 MED ORDER — POTASSIUM CHLORIDE CRYS ER 20 MEQ PO TBCR
20.0000 meq | EXTENDED_RELEASE_TABLET | Freq: Once | ORAL | Status: AC
  Administered 2019-04-21: 20 meq via ORAL
  Filled 2019-04-21: qty 1

## 2019-04-21 MED ORDER — SODIUM CHLORIDE 0.9% FLUSH
10.0000 mL | Freq: Two times a day (BID) | INTRAVENOUS | Status: DC
  Administered 2019-04-21: 10 mL via INTRAVENOUS

## 2019-04-21 MED ORDER — SPIRONOLACTONE 25 MG PO TABS
25.0000 mg | ORAL_TABLET | Freq: Every day | ORAL | Status: DC
  Administered 2019-04-21 – 2019-04-22 (×2): 25 mg via ORAL
  Filled 2019-04-21 (×2): qty 1

## 2019-04-21 MED ORDER — ISOSORBIDE MONONITRATE ER 30 MG PO TB24
30.0000 mg | ORAL_TABLET | Freq: Every day | ORAL | Status: DC
  Administered 2019-04-21 – 2019-04-22 (×2): 30 mg via ORAL
  Filled 2019-04-21 (×2): qty 1

## 2019-04-21 NOTE — Progress Notes (Signed)
[facility] Encounter Note  Patient: Dennis Peck / Admit Date: 04/20/2019 / Date of Encounter: 04/21/2019, 8:55 AM   Subjective: Patient is significantly improved from admission with anterior ST elevation myocardial infarction.  No further evidence of chest discomfort at this time. Cardiac catheterization showing occlusion of left anterior descending artery with PCI and stent placement and successful improvements There is residual 99% stenosis of very distal PDA atherosclerosis of unknown current symptomatic significant Echocardiogram showing apical and anteroseptal hypokinesis with ejection fraction of 35 to 40% consistent with above ST elevation myocardial infarction  Review of Systems: Positive for: None Negative for: Vision change, hearing change, syncope, dizziness, nausea, vomiting,diarrhea, bloody stool, stomach pain, cough, congestion, diaphoresis, urinary frequency, urinary pain,skin lesions, skin rashes Others previously listed  Objective: Telemetry: Normal sinus rhythm Physical Exam: Blood pressure 129/81, pulse 79, temperature 99.3 F (37.4 C), temperature source Oral, resp. rate (!) 21, height 5\' 9"  (1.753 m), weight 105.6 kg, SpO2 95 %. Body mass index is 34.38 kg/m. General: Well developed, well nourished, in no acute distress. Head: Normocephalic, atraumatic, sclera non-icteric, no xanthomas, nares are without discharge. Neck: No apparent masses Lungs: Normal respirations with no wheezes, no rhonchi, no rales , no crackles   Heart: Regular rate and rhythm, normal S1 S2, no murmur, no rub, no gallop, PMI is normal size and placement, carotid upstroke normal without bruit, jugular venous pressure normal Abdomen: Soft, non-tender, non-distended with normoactive bowel sounds. No hepatosplenomegaly. Abdominal aorta is normal size without bruit Extremities: No edema, no clubbing, no cyanosis, no ulcers,  Peripheral: 2+ radial, 2+ femoral, 2+  dorsal pedal pulses Neuro: Alert and oriented. Moves all extremities spontaneously. Psych:  Responds to questions appropriately with a normal affect.   Intake/Output Summary (Last 24 hours) at 04/21/2019 0855 Last data filed at 04/21/2019 0500 Gross per 24 hour  Intake 24.49 ml  Output 3950 ml  Net -3925.51 ml    Inpatient Medications:  . aspirin  81 mg Oral Daily  . atorvastatin  80 mg Oral q1800  . Chlorhexidine Gluconate Cloth  6 each Topical Daily  . heart attack bouncing book   Does not apply Once  . heparin  5,000 Units Subcutaneous Q8H  . isosorbide mononitrate  30 mg Oral Daily  . metoprolol succinate  25 mg Oral Daily  . sertraline  50 mg Oral Daily  . sodium chloride flush  3 mL Intravenous Q12H  . ticagrelor  90 mg Oral BID   Infusions:  . sodium chloride    . nitroGLYCERIN Stopped (04/20/19 0723)    Labs: Recent Labs    04/20/19 0340 04/21/19 0455  NA 136 137  K 3.5 3.3*  CL 100 102  CO2 24 25  GLUCOSE 173* 178*  BUN 34* 20  CREATININE 1.37* 1.04  CALCIUM 9.0 9.0   Recent Labs    04/20/19 0021  AST 64*  ALT 82*  ALKPHOS 70  BILITOT 0.8  PROT 7.0  ALBUMIN 4.4   Recent Labs    04/20/19 0021 04/20/19 0340 04/21/19 0455  WBC 9.6 9.6 7.1  NEUTROABS 6.0  --   --   HGB 13.6 12.8* 12.5*  HCT 36.7* 34.4* 35.6*  MCV 80.3 80.2 84.2  PLT 183 181 150   No results for input(s): CKTOTAL, CKMB, TROPONINI in the last 72 hours. Invalid input(s): [place]augh    04/20/19 0549  HGBA1C 7.3*     Weights: [employer]   04/20/19 0027 04/20/19 0330  Weight: 108.9 kg 105.6 kg     Radiology/Studies:  CARDIAC CATHETERIZATION  Result Date: 04/20/2019 Conclusions: 1. Severe two-vessel coronary artery disease with thrombotic occlusion of the proximal/mid LAD involving the ostium of D1, as well as 95% stenosis of moderate to large caliber rPDA. 2. Mildly elevated left ventricular filling pressure. 3. Challenging but ultimately successful PCI to  the proximal/mid LAD using Resolute Onyx 2.75 x 38 mm drug-eluting stent (post-dilated up to 3.6 mm proximally) with 0% residual stenosis and TIMI-3 flow.  Jailed D1 branch shows stable 70-80% ostial stenosis with TIMI-3 flow. 4. Difficult engagement of the left coronary artery requiring multiple guide catheters and significant contrast use.  Door-to-balloon time was delayed due to challenging anatomy.  Consider alternative access for future catheterizations. Recommendations: 1. Dual antiplatelet therapy with aspirin and ticagrelor for at least 12 months. 2. Continue tirofiban infusion for 4 hours. 3. If patient has continued chest pain, consider adding NTG infusion for antianginal therapy. 4. Aggressive secondary prevention, including high-intensity statin therapy. 5. Obtain transthoracic echocardiogram. 6. Anticipate staged PCI to rPDA.  Recommend deferring this at least 2 days, if possible, given significant contrast use during today's case. Nelva Bush, MD [facility]   ECHOCARDIOGRAM COMPLETE  Result Date: 04/21/2019   ECHOCARDIOGRAM REPORT   Patient Name:   Dennis Peck Date of Exam: 04/20/2019 Medical Rec #:  [id number]           Height:       69.0 in Accession #:    [id number]          Weight:       232.8 lb Date of Birth:  Sep 11, 1955           BSA:          2.20 m Patient Age:    63 years            BP:           121/74 mmHg Patient Gender: M                   HR:           82 bpm. Exam Location:  [facility] Procedure: 2D Echo, Color Doppler and Cardiac Doppler Indications:     Acute myocardial infarction 410  History:         Patient has no prior history of Echocardiogram examinations.                  Risk Factors:Hypertension.  Sonographer:     Sherrie Sport RDCS (AE) Referring Phys:  [place] Diagnosing Phys: Serafina Royals MD IMPRESSIONS  1. Left ventricular ejection fraction, by visual estimation, is 35 to 40%. The left ventricle has mild to moderately decreased function. There is  no left ventricular hypertrophy.  2. Moderate anteroapical and anteroseptal myocardial infarction.  3. The left ventricle demonstrates regional wall motion abnormalities.  4. Global right ventricle has normal systolic function.The right ventricular size is normal. No increase in right ventricular wall thickness.  5. Left atrial size was normal.  6. Right atrial size was normal.  7. The mitral valve is normal in structure. Mild mitral valve regurgitation.  8. The tricuspid valve is normal in structure.  9. The aortic valve is normal in structure. Aortic valve regurgitation is trivial. 10. The pulmonic valve was grossly normal. Pulmonic valve regurgitation is not visualized. 11. Normal pulmonary artery systolic pressure. FINDINGS  Left Ventricle: Left ventricular ejection fraction, by visual estimation,  is 35 to 40%. The left ventricle has mild to moderately decreased function. The left ventricle demonstrates regional wall motion abnormalities. There is no left ventricular hypertrophy. There is evidence of a moderate anteroapical and anteroseptal myocardial infarction. Right Ventricle: The right ventricular size is normal. No increase in right ventricular wall thickness. Global RV systolic function is has normal systolic function. The tricuspid regurgitant velocity is 2.09 m/s, and with an assumed right atrial pressure  of 10 mmHg, the estimated right ventricular systolic pressure is normal at 27.5 mmHg. Left Atrium: Left atrial size was normal in size. Right Atrium: Right atrial size was normal in size Pericardium: There is no evidence of pericardial effusion. Mitral Valve: The mitral valve is normal in structure. Mild mitral valve regurgitation. Tricuspid Valve: The tricuspid valve is normal in structure. Tricuspid valve regurgitation is trivial. Aortic Valve: The aortic valve is normal in structure. Aortic valve regurgitation is trivial. Aortic valve mean gradient measures 3.5 mmHg. Aortic valve peak gradient  measures 5.7 mmHg. Aortic valve area, by VTI measures 3.67 cm. Pulmonic Valve: The pulmonic valve was grossly normal. Pulmonic valve regurgitation is not visualized. Pulmonic regurgitation is not visualized. Aorta: The aortic root, ascending aorta and aortic arch are all structurally normal, with no evidence of dilitation or obstruction. IAS/Shunts: No atrial level shunt detected by color flow Doppler.  LEFT VENTRICLE PLAX 2D LVIDd:         5.37 cm       Diastology LVIDs:         3.91 cm       LV e' lateral:   6.31 cm/s LV PW:         1.14 cm       LV E/e' lateral: 13.0 LV IVS:        1.32 cm       LV e' medial:    5.33 cm/s LVOT diam:     2.00 cm       LV E/e' medial:  15.4 LV SV:         73 ml LV SV Index:   31.75 LVOT Area:     3.14 cm  LV Volumes (MOD) LV area d, A4C:    30.60 cm LV area s, A4C:    19.90 cm LV major d, A4C:   7.93 cm LV major s, A4C:   7.55 cm LV vol d, MOD A4C: 97.2 ml LV vol s, MOD A4C: 44.0 ml LV SV MOD A4C:     97.2 ml RIGHT VENTRICLE RV Basal diam:  2.88 cm LEFT ATRIUM             Index       RIGHT ATRIUM           Index LA diam:        2.90 cm 1.32 cm/m  RA Area:     16.80 cm LA Vol (A2C):   58.9 ml 26.73 ml/m RA Volume:   45.10 ml  20.46 ml/m LA Vol (A4C):   46.5 ml 21.10 ml/m LA Biplane Vol: 56.0 ml 25.41 ml/m  AORTIC VALVE                   PULMONIC VALVE AV Area (Vmax):    2.79 cm    PV Vmax:        0.90 m/s AV Area (Vmean):   2.93 cm    PV Peak grad:   3.3 mmHg AV Area (VTI):     3.67 cm  RVOT Peak grad: 2 mmHg AV Vmax:           119.50 cm/s AV Vmean:          82.100 cm/s AV VTI:            0.184 m AV Peak Grad:      5.7 mmHg AV Mean Grad:      3.5 mmHg LVOT Vmax:         106.00 cm/s LVOT Vmean:        76.600 cm/s LVOT VTI:          0.215 m LVOT/AV VTI ratio: 1.17  AORTA Ao Root diam: 3.30 cm MITRAL VALVE                        TRICUSPID VALVE MV Area (PHT): 5.27 cm             TR Peak grad:   17.5 mmHg MV PHT:        41.76 msec           TR Vmax:        209.00 cm/s MV  Decel Time: 144 msec MV E velocity: 82.00 cm/s 103 cm/s  SHUNTS MV A velocity: 69.40 cm/s 70.3 cm/s Systemic VTI:  0.22 m MV E/A ratio:  1.18       1.5       Systemic Diam: 2.00 cm  Serafina Royals MD Electronically signed by Serafina Royals MD Signature Date/Time: 04/21/2019/8:05:06 AM    Final      Assessment and Recommendation  63 y.o. male with hypertension hyperlipidemia having acute ST elevation myocardial infarction LV systolic dysfunction now improved symptomatic 1.  Dual antiplatelet therapy 2.  High intensity cholesterol therapy 3.  Metoprolol ACE inhibitor spironolactone for medication management of anterior myocardial infarction 4.  Isosorbide for concerns of distal right coronary artery PDA stenosis 5.  Transfer to telemetry and begin ambulating finding if patient has any significant symptoms post infarct 6.  Possible discharge home tomorrow if patient ambulating well  Signed, Serafina Royals M.D. FACC

## 2019-04-21 NOTE — Plan of Care (Signed)
  Problem: Education: Goal: Knowledge of General Education information will improve Description: Including pain rating scale, medication(s)/side effects and non-pharmacologic comfort measures Outcome: Progressing   Problem: Activity: Goal: Ability to tolerate increased activity will improve Outcome: Progressing   Problem: Clinical Measurements: Goal: Respiratory complications will improve Outcome: Completed/Met Goal: Cardiovascular complication will be avoided Outcome: Completed/Met   Problem: Safety: Goal: Ability to remain free from injury will improve Outcome: Completed/Met   Problem: Cardiac: Goal: Vascular access site(s) Level 0-1 will be maintained Outcome: Completed/Met

## 2019-04-21 NOTE — Plan of Care (Signed)
Nutrition Education Note  RD consulted for nutrition education regarding a Heart Healthy diet.   Lipid Panel     Component Value Date/Time   CHOL 283 (H) 04/20/2019 0340   TRIG 412 (H) 04/20/2019 0340   HDL 40 (L) 04/20/2019 0340   CHOLHDL 7.1 04/20/2019 0340   VLDL UNABLE TO CALCULATE IF TRIGLYCERIDE OVER 400 mg/dL 04/20/2019 0340   LDLCALC UNABLE TO CALCULATE IF TRIGLYCERIDE OVER 400 mg/dL 04/20/2019 0340    Met with patient at bedside. He reports his appetite and intake are good. He is eating 100% of meals here. He reports he typically eats fruits, vegetables, and whole grains at home. He also includes heart-healthy fats such as olive oil in his diet. Reports eating adequate protein. No further nutrition questions at end of education. Encouraged patient to call with any questions.  RD provided "Heart Healthy Nutrition Therapy" handout from the Academy of [employer]. Reviewed patient's dietary recall. Provided examples on ways to decrease sodium and fat intake in diet. Discouraged intake of processed foods and use of salt shaker. Encouraged fresh fruits and vegetables as well as whole grain sources of carbohydrates to maximize fiber intake. Teach back method used.  Expect good compliance.  Body mass index is 34.38 kg/m. Pt meets criteria for obesity class I based on current BMI.  Current diet order is heart healthy, patient is consuming approximately 100% of meals at this time. Labs and medications reviewed. No further nutrition interventions warranted at this time. RD contact information provided. If additional nutrition issues arise, please re-consult RD.  Jacklynn Barnacle, MS, RD, LDN Office: [phone] Pager: [phone] After Hours/Weekend Pager: [phone]

## 2019-04-21 NOTE — Progress Notes (Signed)
[facility] [place] NAME: Dennis Peck    MR#:  [id number]  DATE OF BIRTH:  [birth year]  SUBJECTIVE:  came in with chest pain with radiation to left arm. Underwent emergent cardiac cath with stent placement. Patient chest pain-free. Ambulating in the room. No shortness of breath.  REVIEW OF SYSTEMS:   Review of Systems  Constitutional: Negative for chills, fever and weight loss.  HENT: Negative for ear discharge, ear pain and nosebleeds.   Eyes: Negative for blurred vision, pain and discharge.  Respiratory: Negative for sputum production, shortness of breath, wheezing and stridor.   Cardiovascular: Negative for chest pain, palpitations, orthopnea and PND.  Gastrointestinal: Negative for abdominal pain, diarrhea, nausea and vomiting.  Genitourinary: Negative for frequency and urgency.  Musculoskeletal: Negative for back pain and joint pain.  Neurological: Negative for sensory change, speech change, focal weakness and weakness.  Psychiatric/Behavioral: Negative for depression and hallucinations. The patient is not nervous/anxious.    Tolerating Diet: yes Tolerating PT: not needed  DRUG ALLERGIES:  No Known Allergies  VITALS:  Blood pressure 120/70, pulse 83, temperature 98 F (36.7 C), temperature source Axillary, resp. rate 12, height 5\' 9"  (1.753 m), weight 105.6 kg, SpO2 97 %.  PHYSICAL EXAMINATION:   Physical Exam  GENERAL:  63 y.o.-year-old patient lying in the bed with no acute distress.  EYES: Pupils equal, round, reactive to light and accommodation. No scleral icterus. Extraocular muscles intact.  HEENT: Head atraumatic, normocephalic. Oropharynx and nasopharynx clear.  NECK:  Supple, no jugular venous distention. No thyroid enlargement, no tenderness.  LUNGS: Normal breath sounds bilaterally, no wheezing, rales, rhonchi. No use of accessory muscles of respiration.  CARDIOVASCULAR: S1, S2 normal. No murmurs, rubs, or  gallops.  ABDOMEN: Soft, nontender, nondistended. Bowel sounds present. No organomegaly or mass.  EXTREMITIES: No cyanosis, clubbing or edema b/l.    NEUROLOGIC: Cranial nerves Peck through XII are intact. No focal Motor or sensory deficits b/l.   PSYCHIATRIC:  patient is alert and oriented x 3.  SKIN: No obvious rash, lesion, or ulcer.   LABORATORY PANEL:  CBC Recent Labs  Lab 04/21/19 0455  WBC 7.1  HGB 12.5*  HCT 35.6*  PLT 150    Chemistries  Recent Labs  Lab 04/20/19 0021 04/21/19 0455  NA 136 137  K 3.4* 3.3*  CL 99 102  CO2 24 25  GLUCOSE 179* 178*  BUN 34* 20  CREATININE 1.53* 1.04  CALCIUM 9.5 9.0  AST 64*  --   ALT 82*  --   ALKPHOS 70  --   BILITOT 0.8  --    Cardiac Enzymes No results for input(s): TROPONINI in the last 168 hours. RADIOLOGY:  CARDIAC CATHETERIZATION  Result Date: 04/20/2019 Conclusions: 1. Severe two-vessel coronary artery disease with thrombotic occlusion of the proximal/mid LAD involving the ostium of D1, as well as 95% stenosis of moderate to large caliber rPDA. 2. Mildly elevated left ventricular filling pressure. 3. Challenging but ultimately successful PCI to the proximal/mid LAD using Resolute Onyx 2.75 x 38 mm drug-eluting stent (post-dilated up to 3.6 mm proximally) with 0% residual stenosis and TIMI-3 flow.  Jailed D1 branch shows stable 70-80% ostial stenosis with TIMI-3 flow. 4. Difficult engagement of the left coronary artery requiring multiple guide catheters and significant contrast use.  Door-to-balloon time was delayed due to challenging anatomy.  Consider alternative access for future catheterizations. Recommendations: 1. Dual antiplatelet therapy with aspirin and ticagrelor for at least  12 months. 2. Continue tirofiban infusion for 4 hours. 3. If patient has continued chest pain, consider adding NTG infusion for antianginal therapy. 4. Aggressive secondary prevention, including high-intensity statin therapy. 5. Obtain  transthoracic echocardiogram. 6. Anticipate staged PCI to rPDA.  Recommend deferring this at least 2 days, if possible, given significant contrast use during today's case. Nelva Bush, MD [facility]   ECHOCARDIOGRAM COMPLETE  Result Date: 04/21/2019   ECHOCARDIOGRAM REPORT   Patient Name:   Dennis Peck Date of Exam: 04/20/2019 Medical Rec #:  [id number]           Height:       69.0 in Accession #:    [id number]          Weight:       232.8 lb Date of Birth:  1955/10/14           BSA:          2.20 m Patient Age:    65 years            BP:           121/74 mmHg Patient Gender: M                   HR:           82 bpm. Exam Location:  [facility] Procedure: 2D Echo, Color Doppler and Cardiac Doppler Indications:     Acute myocardial infarction 410  History:         Patient has no prior history of Echocardiogram examinations.                  Risk Factors:Hypertension.  Sonographer:     Sherrie Sport RDCS (AE) Referring Phys:  [place] Diagnosing Phys: Serafina Royals MD IMPRESSIONS  1. Left ventricular ejection fraction, by visual estimation, is 35 to 40%. The left ventricle has mild to moderately decreased function. There is no left ventricular hypertrophy.  2. Moderate anteroapical and anteroseptal myocardial infarction.  3. The left ventricle demonstrates regional wall motion abnormalities.  4. Global right ventricle has normal systolic function.The right ventricular size is normal. No increase in right ventricular wall thickness.  5. Left atrial size was normal.  6. Right atrial size was normal.  7. The mitral valve is normal in structure. Mild mitral valve regurgitation.  8. The tricuspid valve is normal in structure.  9. The aortic valve is normal in structure. Aortic valve regurgitation is trivial. 10. The pulmonic valve was grossly normal. Pulmonic valve regurgitation is not visualized. 11. Normal pulmonary artery systolic pressure. FINDINGS  Left Ventricle: Left ventricular ejection  fraction, by visual estimation, is 35 to 40%. The left ventricle has mild to moderately decreased function. The left ventricle demonstrates regional wall motion abnormalities. There is no left ventricular hypertrophy. There is evidence of a moderate anteroapical and anteroseptal myocardial infarction. Right Ventricle: The right ventricular size is normal. No increase in right ventricular wall thickness. Global RV systolic function is has normal systolic function. The tricuspid regurgitant velocity is 2.09 m/s, and with an assumed right atrial pressure  of 10 mmHg, the estimated right ventricular systolic pressure is normal at 27.5 mmHg. Left Atrium: Left atrial size was normal in size. Right Atrium: Right atrial size was normal in size Pericardium: There is no evidence of pericardial effusion. Mitral Valve: The mitral valve is normal in structure. Mild mitral valve regurgitation. Tricuspid Valve: The tricuspid valve is normal in structure. Tricuspid valve regurgitation  is trivial. Aortic Valve: The aortic valve is normal in structure. Aortic valve regurgitation is trivial. Aortic valve mean gradient measures 3.5 mmHg. Aortic valve peak gradient measures 5.7 mmHg. Aortic valve area, by VTI measures 3.67 cm. Pulmonic Valve: The pulmonic valve was grossly normal. Pulmonic valve regurgitation is not visualized. Pulmonic regurgitation is not visualized. Aorta: The aortic root, ascending aorta and aortic arch are all structurally normal, with no evidence of dilitation or obstruction. IAS/Shunts: No atrial level shunt detected by color flow Doppler.  LEFT VENTRICLE PLAX 2D LVIDd:         5.37 cm       Diastology LVIDs:         3.91 cm       LV e' lateral:   6.31 cm/s LV PW:         1.14 cm       LV E/e' lateral: 13.0 LV IVS:        1.32 cm       LV e' medial:    5.33 cm/s LVOT diam:     2.00 cm       LV E/e' medial:  15.4 LV SV:         73 ml LV SV Index:   31.75 LVOT Area:     3.14 cm  LV Volumes (MOD) LV area d, A4C:     30.60 cm LV area s, A4C:    19.90 cm LV major d, A4C:   7.93 cm LV major s, A4C:   7.55 cm LV vol d, MOD A4C: 97.2 ml LV vol s, MOD A4C: 44.0 ml LV SV MOD A4C:     97.2 ml RIGHT VENTRICLE RV Basal diam:  2.88 cm LEFT ATRIUM             Index       RIGHT ATRIUM           Index LA diam:        2.90 cm 1.32 cm/m  RA Area:     16.80 cm LA Vol (A2C):   58.9 ml 26.73 ml/m RA Volume:   45.10 ml  20.46 ml/m LA Vol (A4C):   46.5 ml 21.10 ml/m LA Biplane Vol: 56.0 ml 25.41 ml/m  AORTIC VALVE                   PULMONIC VALVE AV Area (Vmax):    2.79 cm    PV Vmax:        0.90 m/s AV Area (Vmean):   2.93 cm    PV Peak grad:   3.3 mmHg AV Area (VTI):     3.67 cm    RVOT Peak grad: 2 mmHg AV Vmax:           119.50 cm/s AV Vmean:          82.100 cm/s AV VTI:            0.184 m AV Peak Grad:      5.7 mmHg AV Mean Grad:      3.5 mmHg LVOT Vmax:         106.00 cm/s LVOT Vmean:        76.600 cm/s LVOT VTI:          0.215 m LVOT/AV VTI ratio: 1.17  AORTA Ao Root diam: 3.30 cm MITRAL VALVE  TRICUSPID VALVE MV Area (PHT): 5.27 cm             TR Peak grad:   17.5 mmHg MV PHT:        41.76 msec           TR Vmax:        209.00 cm/s MV Decel Time: 144 msec MV E velocity: 82.00 cm/s 103 cm/s  SHUNTS MV A velocity: 69.40 cm/s 70.3 cm/s Systemic VTI:  0.22 m MV E/A ratio:  1.18       1.5       Systemic Diam: 2.00 cm  Serafina Royals MD Electronically signed by Serafina Royals MD Signature Date/Time: 04/21/2019/8:05:06 AM    Final    ASSESSMENT AND PLAN:  Dennis Peck is a 63 y.o. male with medical history significant for hypertension and hyperlipidemia who presented to the emergency room with chest pain and found to have an anterior STEMI.  * Acute STEMI involving left anterior descending coronary artery [facility]) --s/p Emergent cardiac catheterization revealed thrombotic occlusion of the proximal/mid LAD, with significant stenosis also involving the ostium of D1.  There was also a 95% lesion involving  the RPDA.  The patient underwent successful primary PCI to the proximal and mid LAD DES to LAD lesion -- patient is chest pain-free. Troponin trending down --Currently on aspirin, atorvastatin, metoprolol, imdur, spironolactone brilinta.  -Prn NTG and morphine --Cardiology consult with Dr. Nehemiah Massed-- continue will medical management  * Essential hypertension --As needed hydralazine and abovecardiac meds  Procedures: cardiac cath Family communication : patient Consults : cardiology Dr. Nehemiah Massed Discharge Disposition : home anticipate tomorrow CODE STATUS: full code DVT Prophylaxis : heparin  TOTAL TIME TAKING CARE OF THIS PATIENT: five minutes.  >50% time spent on counselling and coordination of care  POSSIBLE D/C IN *one** DAYS, DEPENDING ON CLINICAL CONDITION.  Note: This dictation was prepared with Dragon dictation along with smaller phrase technology. Any transcriptional errors that result from this process are unintentional.  Fritzi Mandes M.D on 04/21/2019 at 4:56 PM  Between 7am to 6pm - Pager - [phone]  After 6pm go to [email or web address]  [facility]   CC: Primary care physician; Rusty Aus, MDPatient ID: Dennis Peck, male   DOB: Oct 18, 1955, 63 y.o.   MRN: [id number]

## 2019-04-22 DIAGNOSIS — E785 Hyperlipidemia, unspecified: Secondary | ICD-10-CM

## 2019-04-22 LAB — GLUCOSE, CAPILLARY: Glucose-Capillary: 164 mg/dL — ABNORMAL HIGH (ref 70–99)

## 2019-04-22 MED ORDER — TICAGRELOR 90 MG PO TABS: 90.0000 mg | ORAL_TABLET | Freq: Two times a day (BID) | ORAL | 3 refills | Status: DC

## 2019-04-22 MED ORDER — ISOSORBIDE MONONITRATE ER 30 MG PO TB24: 30.0000 mg | ORAL_TABLET | Freq: Every day | ORAL | 11 refills | Status: DC

## 2019-04-22 MED ORDER — SPIRONOLACTONE 25 MG PO TABS: 25.0000 mg | ORAL_TABLET | Freq: Every day | ORAL | 1 refills | Status: DC

## 2019-04-22 MED ORDER — ATORVASTATIN CALCIUM 80 MG PO TABS: 80.0000 mg | ORAL_TABLET | Freq: Every day | ORAL | 1 refills | Status: DC

## 2019-04-22 MED ORDER — NITROGLYCERIN 0.4 MG SL SUBL: 0.4000 mg | SUBLINGUAL_TABLET | SUBLINGUAL | 12 refills | Status: DC | PRN

## 2019-04-22 MED ORDER — ATORVASTATIN CALCIUM 80 MG PO TABS: 80.0000 mg | ORAL_TABLET | Freq: Every day | ORAL | 1 refills | Status: AC

## 2019-04-22 MED ORDER — NITROGLYCERIN 0.4 MG SL SUBL: 0.4000 mg | SUBLINGUAL_TABLET | SUBLINGUAL | 12 refills | Status: AC | PRN

## 2019-04-22 MED ORDER — ASPIRIN 81 MG PO CHEW: 81.0000 mg | CHEWABLE_TABLET | Freq: Every day | ORAL | 1 refills | Status: AC

## 2019-04-22 MED ORDER — ASPIRIN 81 MG PO CHEW: 81.0000 mg | CHEWABLE_TABLET | Freq: Every day | ORAL | 1 refills | Status: DC

## 2019-04-22 MED ORDER — TELMISARTAN 40 MG PO TABS: 40.0000 mg | ORAL_TABLET | Freq: Every day | ORAL | 0 refills | Status: DC

## 2019-04-22 MED ORDER — TELMISARTAN 40 MG PO TABS: 80.0000 mg | ORAL_TABLET | Freq: Every day | ORAL | 0 refills | Status: DC

## 2019-04-22 NOTE — Progress Notes (Signed)
Patient discharged to home. Tele and IV d/c'd. Patient verbalizes understanding of discharge instructions and post cath radial site care.  Site with gauze dressing in place without drainage, hematoma or pain.

## 2019-04-22 NOTE — Discharge Summary (Signed)
[facility] NAME: Dennis Peck    MR#:  [id number]  DATE OF BIRTH:  1956-03-27  DATE OF ADMISSION:  04/20/2019 ADMITTING PHYSICIAN: Athena Masse, MD  DATE OF DISCHARGE: 04/22/2019  PRIMARY CARE PHYSICIAN: Rusty Aus, MD    ADMISSION DIAGNOSIS:  STEMI involving left anterior descending coronary artery ([place]) [I21.02]  DISCHARGE DIAGNOSIS:  Acute STEMI  SECONDARY DIAGNOSIS:   Past Medical History:  Diagnosis Date  . Hypertension     HOSPITAL COURSE:   Dennis Peck IIis a 63 y.o.malewith medical history significant forhypertension and hyperlipidemia who presented to the emergency room with chest pain and found to have an anterior STEMI.  * AcuteSTEMI involving left anterior descending coronary artery (HCC) --s/p Emergent cardiac catheterization revealed thrombotic occlusion of the proximal/mid LAD, with significant stenosis also involving the ostium of D1. There was also a 95% lesion involving the RPDA. The patient underwent successful primary PCI to the proximal and mid LAD DES to LAD lesion -- patient is chest pain-free. Troponin trending down --Currently on aspirin, atorvastatin,metoprolol, imdur, spironolactone brilinta.  -resumed telmisartan at 40 mg -Prn NTG  --Cardiology consult with Dr. Nehemiah Massed-- continue  medical management for now  -cardiac Rehab at discharge  * Essential hypertension --As needed hydralazine and above cardiac meds  *HL -atorvastatin  Procedures: cardiac cath Family communication : patient Consults : cardiology Dr. Nehemiah Massed Discharge Disposition : home today  CODE STATUS: full code DVT Prophylaxis : heparin CONSULTS OBTAINED:  Treatment Team:  Corey Skains, MD  DRUG ALLERGIES:  No Known Allergies  DISCHARGE MEDICATIONS:   Allergies as of 04/22/2019   No Known Allergies     Medication List    STOP taking these medications   etodolac 400 MG  tablet Commonly known as: LODINE   hydrochlorothiazide 50 MG tablet Commonly known as: HYDRODIURIL     TAKE these medications   aspirin 81 MG chewable tablet Chew 1 tablet (81 mg total) by mouth daily.   atorvastatin 80 MG tablet Commonly known as: LIPITOR Take 1 tablet (80 mg total) by mouth daily at 6 PM.   geriatric multivitamins-minerals Elix Take 15 mLs by mouth daily.   glucosamine-chondroitin 500-400 MG tablet Take 1 tablet by mouth 3 (three) times daily.   isosorbide mononitrate 30 MG 24 hr tablet Commonly known as: IMDUR Take 30 mg by mouth daily.   magnesium (amino acid chelate) 133 MG tablet Take 1 tablet by mouth 2 (two) times daily.   metoprolol succinate 50 MG 24 hr tablet Commonly known as: TOPROL-XL Take 50 mg by mouth daily. Take with or immediately following a meal.   nitroGLYCERIN 0.4 MG SL tablet Commonly known as: NITROSTAT Place 1 tablet (0.4 mg total) under the tongue every 5 (five) minutes as needed for chest pain.   potassium chloride 10 MEQ tablet Commonly known as: KLOR-CON Take 10 mEq by mouth daily.   sertraline 50 MG tablet Commonly known as: ZOLOFT Take 50 mg by mouth daily.   spironolactone 25 MG tablet Commonly known as: ALDACTONE Take 1 tablet (25 mg total) by mouth daily.   telmisartan 40 MG tablet Commonly known as: MICARDIS Take 2 tablets (80 mg total) by mouth daily. What changed: medication strength   ticagrelor 90 MG Tabs tablet Commonly known as: BRILINTA Take 1 tablet (90 mg total) by mouth 2 (two) times daily.       If you experience worsening of your admission symptoms, develop  shortness of breath, life threatening emergency, suicidal or homicidal thoughts you must seek medical attention immediately by calling 911 or calling your MD immediately  if symptoms less severe.  You Must read complete instructions/literature along with all the possible adverse reactions/side effects for all the Medicines you take and  that have been prescribed to you. Take any new Medicines after you have completely understood and accept all the possible adverse reactions/side effects.   Please note  You were cared for by a hospitalist during your hospital stay. If you have any questions about your discharge medications or the care you received while you were in the hospital after you are discharged, you can call the unit and asked to speak with the hospitalist on call if the hospitalist that took care of you is not available. Once you are discharged, your primary care physician will handle any further medical issues. Please note that NO REFILLS for any discharge medications will be authorized once you are discharged, as it is imperative that you return to your primary care physician (or establish a relationship with a primary care physician if you do not have one) for your aftercare needs so that they can reassess your need for medications and monitor your lab values. Today   SUBJECTIVE  No cp   VITAL SIGNS:  Blood pressure 122/73, pulse 72, temperature 97.8 F (36.6 C), temperature source Oral, resp. rate 18, height 5\' 9"  (1.753 m), weight 104.6 kg, SpO2 99 %.  I/O:    Intake/Output Summary (Last 24 hours) at 04/22/2019 0830 Last data filed at 04/21/2019 2203 Gross per 24 hour  Intake 240 ml  Output 1500 ml  Net -1260 ml    PHYSICAL EXAMINATION:  GENERAL:  63 y.o.-year-old patient lying in the bed with no acute distress.  EYES: Pupils equal, round, reactive to light and accommodation. No scleral icterus. Extraocular muscles intact.  HEENT: Head atraumatic, normocephalic. Oropharynx and nasopharynx clear.  NECK:  Supple, no jugular venous distention. No thyroid enlargement, no tenderness.  LUNGS: Normal breath sounds bilaterally, no wheezing, rales,rhonchi or crepitation. No use of accessory muscles of respiration.  CARDIOVASCULAR: S1, S2 normal. No murmurs, rubs, or gallops.  ABDOMEN: Soft, non-tender,  non-distended. Bowel sounds present. No organomegaly or mass.  EXTREMITIES: No pedal edema, cyanosis, or clubbing.  NEUROLOGIC: Cranial nerves Peck through XII are intact. Muscle strength 5/5 in all extremities. Sensation intact. Gait not checked.  PSYCHIATRIC: The patient is alert and oriented x 3.  SKIN: No obvious rash, lesion, or ulcer.   DATA REVIEW:   CBC  Recent Labs  Lab 04/21/19 0455  WBC 7.1  HGB 12.5*  HCT 35.6*  PLT 150    Chemistries  Recent Labs  Lab 04/20/19 0021 04/21/19 0455  NA 136 137  K 3.4* 3.3*  CL 99 102  CO2 24 25  GLUCOSE 179* 178*  BUN 34* 20  CREATININE 1.53* 1.04  CALCIUM 9.5 9.0  AST 64*  --   ALT 82*  --   ALKPHOS 70  --   BILITOT 0.8  --     Microbiology Results   Recent Results (from the past 240 hour(s))  Respiratory Panel by RT PCR (Flu A&B, Covid) - Nasopharyngeal Swab     Status: None   Collection Time: 04/20/19  1:17 AM   Specimen: Nasopharyngeal Swab  Result Value Ref Range Status   SARS Coronavirus 2 by RT PCR NEGATIVE NEGATIVE Final    Comment: (NOTE) SARS-CoV-2 target nucleic acids are NOT  DETECTED. The SARS-CoV-2 RNA is generally detectable in upper respiratoy specimens during the acute phase of infection. The lowest concentration of SARS-CoV-2 viral copies this assay can detect is 131 copies/mL. A negative result does not preclude SARS-Cov-2 infection and should not be used as the sole basis for treatment or other patient management decisions. A negative result may occur with  improper specimen collection/handling, submission of specimen other than nasopharyngeal swab, presence of viral mutation(s) within the areas targeted by this assay, and inadequate number of viral copies (<131 copies/mL). A negative result must be combined with clinical observations, patient history, and epidemiological information. The expected result is Negative. Fact Sheet for Patients:  PinkCheek.be Fact Sheet  for Healthcare Providers:  GravelBags.it This test is not yet ap proved or cleared by the [place] FDA and  has been authorized for detection and/or diagnosis of SARS-CoV-2 by FDA under an Emergency Use Authorization (EUA). This EUA will remain  in effect (meaning this test can be used) for the duration of the COVID-19 declaration under Section 564(b)(1) of the Act, 21 U.S.C. section 360bbb-3(b)(1), unless the authorization is terminated or revoked sooner.    Influenza A by PCR NEGATIVE NEGATIVE Final   Influenza B by PCR NEGATIVE NEGATIVE Final    Comment: (NOTE) The Xpert Xpress SARS-CoV-2/FLU/RSV assay is intended as an aid in  the diagnosis of influenza from Nasopharyngeal swab specimens and  should not be used as a sole basis for treatment. Nasal washings and  aspirates are unacceptable for Xpert Xpress SARS-CoV-2/FLU/RSV  testing. Fact Sheet for Patients: PinkCheek.be Fact Sheet for Healthcare Providers: GravelBags.it This test is not yet approved or cleared by the [place] FDA and  has been authorized for detection and/or diagnosis of SARS-CoV-2 by  FDA under an Emergency Use Authorization (EUA). This EUA will remain  in effect (meaning this test can be used) for the duration of the  Covid-19 declaration under Section 564(b)(1) of the Act, 21  U.S.C. section 360bbb-3(b)(1), unless the authorization is  terminated or revoked. Performed at [facility], [address] [place], [place] [address]   MRSA PCR Screening     Status: None   Collection Time: 04/20/19  3:29 AM   Specimen: Nasopharyngeal  Result Value Ref Range Status   MRSA by PCR NEGATIVE NEGATIVE Final    Comment:        The GeneXpert MRSA Assay (FDA approved for NASAL specimens only), is one component of a comprehensive MRSA colonization surveillance program. It is not intended to diagnose  MRSA infection nor to guide or monitor treatment for MRSA infections. Performed at [facility], [address] [place], [place] [address]     RADIOLOGY:  ECHOCARDIOGRAM COMPLETE  Result Date: 04/21/2019   ECHOCARDIOGRAM REPORT   Patient Name:   Dennis Peck Date of Exam: 04/20/2019 Medical Rec #:  [id number]           Height:       69.0 in Accession #:    [id number]          Weight:       232.8 lb Date of Birth:  1955-09-24           BSA:          2.20 m Patient Age:    25 years            BP:           121/74 mmHg Patient Gender: M  HR:           82 bpm. Exam Location:  [facility] Procedure: 2D Echo, Color Doppler and Cardiac Doppler Indications:     Acute myocardial infarction 410  History:         Patient has no prior history of Echocardiogram examinations.                  Risk Factors:Hypertension.  Sonographer:     Sherrie Sport RDCS (AE) Referring Phys:  [place] Diagnosing Phys: Serafina Royals MD IMPRESSIONS  1. Left ventricular ejection fraction, by visual estimation, is 35 to 40%. The left ventricle has mild to moderately decreased function. There is no left ventricular hypertrophy.  2. Moderate anteroapical and anteroseptal myocardial infarction.  3. The left ventricle demonstrates regional wall motion abnormalities.  4. Global right ventricle has normal systolic function.The right ventricular size is normal. No increase in right ventricular wall thickness.  5. Left atrial size was normal.  6. Right atrial size was normal.  7. The mitral valve is normal in structure. Mild mitral valve regurgitation.  8. The tricuspid valve is normal in structure.  9. The aortic valve is normal in structure. Aortic valve regurgitation is trivial. 10. The pulmonic valve was grossly normal. Pulmonic valve regurgitation is not visualized. 11. Normal pulmonary artery systolic pressure. FINDINGS  Left Ventricle: Left ventricular ejection fraction, by visual estimation, is 35 to  40%. The left ventricle has mild to moderately decreased function. The left ventricle demonstrates regional wall motion abnormalities. There is no left ventricular hypertrophy. There is evidence of a moderate anteroapical and anteroseptal myocardial infarction. Right Ventricle: The right ventricular size is normal. No increase in right ventricular wall thickness. Global RV systolic function is has normal systolic function. The tricuspid regurgitant velocity is 2.09 m/s, and with an assumed right atrial pressure  of 10 mmHg, the estimated right ventricular systolic pressure is normal at 27.5 mmHg. Left Atrium: Left atrial size was normal in size. Right Atrium: Right atrial size was normal in size Pericardium: There is no evidence of pericardial effusion. Mitral Valve: The mitral valve is normal in structure. Mild mitral valve regurgitation. Tricuspid Valve: The tricuspid valve is normal in structure. Tricuspid valve regurgitation is trivial. Aortic Valve: The aortic valve is normal in structure. Aortic valve regurgitation is trivial. Aortic valve mean gradient measures 3.5 mmHg. Aortic valve peak gradient measures 5.7 mmHg. Aortic valve area, by VTI measures 3.67 cm. Pulmonic Valve: The pulmonic valve was grossly normal. Pulmonic valve regurgitation is not visualized. Pulmonic regurgitation is not visualized. Aorta: The aortic root, ascending aorta and aortic arch are all structurally normal, with no evidence of dilitation or obstruction. IAS/Shunts: No atrial level shunt detected by color flow Doppler.  LEFT VENTRICLE PLAX 2D LVIDd:         5.37 cm       Diastology LVIDs:         3.91 cm       LV e' lateral:   6.31 cm/s LV PW:         1.14 cm       LV E/e' lateral: 13.0 LV IVS:        1.32 cm       LV e' medial:    5.33 cm/s LVOT diam:     2.00 cm       LV E/e' medial:  15.4 LV SV:         73 ml LV SV Index:  31.75 LVOT Area:     3.14 cm  LV Volumes (MOD) LV area d, A4C:    30.60 cm LV area s, A4C:    19.90 cm  LV major d, A4C:   7.93 cm LV major s, A4C:   7.55 cm LV vol d, MOD A4C: 97.2 ml LV vol s, MOD A4C: 44.0 ml LV SV MOD A4C:     97.2 ml RIGHT VENTRICLE RV Basal diam:  2.88 cm LEFT ATRIUM             Index       RIGHT ATRIUM           Index LA diam:        2.90 cm 1.32 cm/m  RA Area:     16.80 cm LA Vol (A2C):   58.9 ml 26.73 ml/m RA Volume:   45.10 ml  20.46 ml/m LA Vol (A4C):   46.5 ml 21.10 ml/m LA Biplane Vol: 56.0 ml 25.41 ml/m  AORTIC VALVE                   PULMONIC VALVE AV Area (Vmax):    2.79 cm    PV Vmax:        0.90 m/s AV Area (Vmean):   2.93 cm    PV Peak grad:   3.3 mmHg AV Area (VTI):     3.67 cm    RVOT Peak grad: 2 mmHg AV Vmax:           119.50 cm/s AV Vmean:          82.100 cm/s AV VTI:            0.184 m AV Peak Grad:      5.7 mmHg AV Mean Grad:      3.5 mmHg LVOT Vmax:         106.00 cm/s LVOT Vmean:        76.600 cm/s LVOT VTI:          0.215 m LVOT/AV VTI ratio: 1.17  AORTA Ao Root diam: 3.30 cm MITRAL VALVE                        TRICUSPID VALVE MV Area (PHT): 5.27 cm             TR Peak grad:   17.5 mmHg MV PHT:        41.76 msec           TR Vmax:        209.00 cm/s MV Decel Time: 144 msec MV E velocity: 82.00 cm/s 103 cm/s  SHUNTS MV A velocity: 69.40 cm/s 70.3 cm/s Systemic VTI:  0.22 m MV E/A ratio:  1.18       1.5       Systemic Diam: 2.00 cm  Serafina Royals MD Electronically signed by Serafina Royals MD Signature Date/Time: 04/21/2019/8:05:06 AM    Final      CODE STATUS:     Code Status Orders  (From admission, onward)         Start     Ordered   04/20/19 0320  Full code  Continuous     04/20/19 0319        Code Status History    This patient has a current code status but no historical code status.   Advance Care Planning Activity       TOTAL TIME TAKING CARE OF THIS PATIENT: 40  minutes.    Fritzi Mandes M.D on 04/22/2019 at 8:30 AM  Between 7am to 6pm - Pager - [phone] After 6pm go to [email or web address] - password [email or web address]  [facility]     CC: Primary care physician; Rusty Aus, MD

## 2019-04-22 NOTE — Progress Notes (Signed)
[facility] Encounter Note  Patient: JUSIAH COPPES II / Admit Date: 04/20/2019 / Date of Encounter: 04/22/2019, 8:42 AM   Subjective: Patient is significantly improved from admission with anterior ST elevation myocardial infarction.  No further evidence of chest discomfort at this time.  Now patient tolerating appropriate medication management for myocardial infarction hypertension hyperlipidemia without evidence of significant side effects other than mild shortness of breath possibly secondary to Brilinta use Cardiac catheterization showing occlusion of left anterior descending artery with PCI and stent placement and successful improvements There is residual 99% stenosis of very distal PDA atherosclerosis of unknown current symptomatic significant Echocardiogram showing apical and anteroseptal hypokinesis with ejection fraction of 35 to 40% consistent with above ST elevation myocardial infarction  Review of Systems: Positive for: None Negative for: Vision change, hearing change, syncope, dizziness, nausea, vomiting,diarrhea, bloody stool, stomach pain, cough, congestion, diaphoresis, urinary frequency, urinary pain,skin lesions, skin rashes Others previously listed  Objective: Telemetry: Normal sinus rhythm Physical Exam: Blood pressure 122/73, pulse 72, temperature 97.8 F (36.6 C), temperature source Oral, resp. rate 18, height 5\' 9"  (1.753 m), weight 104.6 kg, SpO2 99 %. Body mass index is 34.04 kg/m. General: Well developed, well nourished, in no acute distress. Head: Normocephalic, atraumatic, sclera non-icteric, no xanthomas, nares are without discharge. Neck: No apparent masses Lungs: Normal respirations with no wheezes, no rhonchi, no rales , no crackles   Heart: Regular rate and rhythm, normal S1 S2, no murmur, no rub, no gallop, PMI is normal size and placement, carotid upstroke normal without bruit, jugular venous pressure normal Abdomen: Soft,  non-tender, non-distended with normoactive bowel sounds. No hepatosplenomegaly. Abdominal aorta is normal size without bruit Extremities: No edema, no clubbing, no cyanosis, no ulcers,  Peripheral: 2+ radial, 2+ femoral, 2+ dorsal pedal pulses Neuro: Alert and oriented. Moves all extremities spontaneously. Psych:  Responds to questions appropriately with a normal affect.   Intake/Output Summary (Last 24 hours) at 04/22/2019 0842 Last data filed at 04/21/2019 2203 Gross per 24 hour  Intake 240 ml  Output 1500 ml  Net -1260 ml    Inpatient Medications:  . aspirin  81 mg Oral Daily  . atorvastatin  80 mg Oral q1800  . Chlorhexidine Gluconate Cloth  6 each Topical Daily  . heart attack bouncing book   Does not apply Once  . heparin  5,000 Units Subcutaneous Q8H  . isosorbide mononitrate  30 mg Oral Daily  . metoprolol succinate  25 mg Oral Daily  . sertraline  50 mg Oral Daily  . sodium chloride flush  10 mL Intravenous Q12H  . sodium chloride flush  3 mL Intravenous Q12H  . spironolactone  25 mg Oral Daily  . ticagrelor  90 mg Oral BID   Infusions:  . sodium chloride      Labs: Recent Labs    04/20/19 0340 04/21/19 0455  NA 136 137  K 3.5 3.3*  CL 100 102  CO2 24 25  GLUCOSE 173* 178*  BUN 34* 20  CREATININE 1.37* 1.04  CALCIUM 9.0 9.0   Recent Labs    04/20/19 0021  AST 64*  ALT 82*  ALKPHOS 70  BILITOT 0.8  PROT 7.0  ALBUMIN 4.4   Recent Labs    04/20/19 0021 04/20/19 0340 04/21/19 0455  WBC 9.6 9.6 7.1  NEUTROABS 6.0  --   --   HGB 13.6 12.8* 12.5*  HCT 36.7* 34.4* 35.6*  MCV 80.3 80.2 84.2  PLT 183 181 150  No results for input(s): CKTOTAL, CKMB, TROPONINI in the last 72 hours. Invalid input(s): POCBNP Recent Labs    04/20/19 0549  HGBA1C 7.3*     Weights: Filed Weights   04/20/19 0027 04/20/19 0330 04/21/19 1709  Weight: 108.9 kg 105.6 kg 104.6 kg     Radiology/Studies:  CARDIAC CATHETERIZATION  Result Date:  04/20/2019 Conclusions: 1. Severe two-vessel coronary artery disease with thrombotic occlusion of the proximal/mid LAD involving the ostium of D1, as well as 95% stenosis of moderate to large caliber rPDA. 2. Mildly elevated left ventricular filling pressure. 3. Challenging but ultimately successful PCI to the proximal/mid LAD using Resolute Onyx 2.75 x 38 mm drug-eluting stent (post-dilated up to 3.6 mm proximally) with 0% residual stenosis and TIMI-3 flow.  Jailed D1 branch shows stable 70-80% ostial stenosis with TIMI-3 flow. 4. Difficult engagement of the left coronary artery requiring multiple guide catheters and significant contrast use.  Door-to-balloon time was delayed due to challenging anatomy.  Consider alternative access for future catheterizations. Recommendations: 1. Dual antiplatelet therapy with aspirin and ticagrelor for at least 12 months. 2. Continue tirofiban infusion for 4 hours. 3. If patient has continued chest pain, consider adding NTG infusion for antianginal therapy. 4. Aggressive secondary prevention, including high-intensity statin therapy. 5. Obtain transthoracic echocardiogram. 6. Anticipate staged PCI to rPDA.  Recommend deferring this at least 2 days, if possible, given significant contrast use during today's case. Nelva Bush, MD [facility]   ECHOCARDIOGRAM COMPLETE  Result Date: 04/21/2019   ECHOCARDIOGRAM REPORT   Patient Name:   LI ALOI II Date of Exam: 04/20/2019 Medical Rec #:  [id number]           Height:       69.0 in Accession #:    [id number]          Weight:       232.8 lb Date of Birth:  08/03/1955           BSA:          2.20 m Patient Age:    63 years            BP:           121/74 mmHg Patient Gender: M                   HR:           82 bpm. Exam Location:  [facility] Procedure: 2D Echo, Color Doppler and Cardiac Doppler Indications:     Acute myocardial infarction 410  History:         Patient has no prior history of Echocardiogram examinations.                   Risk Factors:Hypertension.  Sonographer:     Sherrie Sport RDCS (AE) Referring Phys:  [place] Diagnosing Phys: Serafina Royals MD IMPRESSIONS  1. Left ventricular ejection fraction, by visual estimation, is 35 to 40%. The left ventricle has mild to moderately decreased function. There is no left ventricular hypertrophy.  2. Moderate anteroapical and anteroseptal myocardial infarction.  3. The left ventricle demonstrates regional wall motion abnormalities.  4. Global right ventricle has normal systolic function.The right ventricular size is normal. No increase in right ventricular wall thickness.  5. Left atrial size was normal.  6. Right atrial size was normal.  7. The mitral valve is normal in structure. Mild mitral valve regurgitation.  8. The tricuspid valve is normal in structure.  9. The aortic valve is normal in structure. Aortic valve regurgitation is trivial. 10. The pulmonic valve was grossly normal. Pulmonic valve regurgitation is not visualized. 11. Normal pulmonary artery systolic pressure. FINDINGS  Left Ventricle: Left ventricular ejection fraction, by visual estimation, is 35 to 40%. The left ventricle has mild to moderately decreased function. The left ventricle demonstrates regional wall motion abnormalities. There is no left ventricular hypertrophy. There is evidence of a moderate anteroapical and anteroseptal myocardial infarction. Right Ventricle: The right ventricular size is normal. No increase in right ventricular wall thickness. Global RV systolic function is has normal systolic function. The tricuspid regurgitant velocity is 2.09 m/s, and with an assumed right atrial pressure  of 10 mmHg, the estimated right ventricular systolic pressure is normal at 27.5 mmHg. Left Atrium: Left atrial size was normal in size. Right Atrium: Right atrial size was normal in size Pericardium: There is no evidence of pericardial effusion. Mitral Valve: The mitral valve is normal in structure.  Mild mitral valve regurgitation. Tricuspid Valve: The tricuspid valve is normal in structure. Tricuspid valve regurgitation is trivial. Aortic Valve: The aortic valve is normal in structure. Aortic valve regurgitation is trivial. Aortic valve mean gradient measures 3.5 mmHg. Aortic valve peak gradient measures 5.7 mmHg. Aortic valve area, by VTI measures 3.67 cm. Pulmonic Valve: The pulmonic valve was grossly normal. Pulmonic valve regurgitation is not visualized. Pulmonic regurgitation is not visualized. Aorta: The aortic root, ascending aorta and aortic arch are all structurally normal, with no evidence of dilitation or obstruction. IAS/Shunts: No atrial level shunt detected by color flow Doppler.  LEFT VENTRICLE PLAX 2D LVIDd:         5.37 cm       Diastology LVIDs:         3.91 cm       LV e' lateral:   6.31 cm/s LV PW:         1.14 cm       LV E/e' lateral: 13.0 LV IVS:        1.32 cm       LV e' medial:    5.33 cm/s LVOT diam:     2.00 cm       LV E/e' medial:  15.4 LV SV:         73 ml LV SV Index:   31.75 LVOT Area:     3.14 cm  LV Volumes (MOD) LV area d, A4C:    30.60 cm LV area s, A4C:    19.90 cm LV major d, A4C:   7.93 cm LV major s, A4C:   7.55 cm LV vol d, MOD A4C: 97.2 ml LV vol s, MOD A4C: 44.0 ml LV SV MOD A4C:     97.2 ml RIGHT VENTRICLE RV Basal diam:  2.88 cm LEFT ATRIUM             Index       RIGHT ATRIUM           Index LA diam:        2.90 cm 1.32 cm/m  RA Area:     16.80 cm LA Vol (A2C):   58.9 ml 26.73 ml/m RA Volume:   45.10 ml  20.46 ml/m LA Vol (A4C):   46.5 ml 21.10 ml/m LA Biplane Vol: 56.0 ml 25.41 ml/m  AORTIC VALVE                   PULMONIC VALVE AV Area (Vmax):  2.79 cm    PV Vmax:        0.90 m/s AV Area (Vmean):   2.93 cm    PV Peak grad:   3.3 mmHg AV Area (VTI):     3.67 cm    RVOT Peak grad: 2 mmHg AV Vmax:           119.50 cm/s AV Vmean:          82.100 cm/s AV VTI:            0.184 m AV Peak Grad:      5.7 mmHg AV Mean Grad:      3.5 mmHg LVOT Vmax:          106.00 cm/s LVOT Vmean:        76.600 cm/s LVOT VTI:          0.215 m LVOT/AV VTI ratio: 1.17  AORTA Ao Root diam: 3.30 cm MITRAL VALVE                        TRICUSPID VALVE MV Area (PHT): 5.27 cm             TR Peak grad:   17.5 mmHg MV PHT:        41.76 msec           TR Vmax:        209.00 cm/s MV Decel Time: 144 msec MV E velocity: 82.00 cm/s 103 cm/s  SHUNTS MV A velocity: 69.40 cm/s 70.3 cm/s Systemic VTI:  0.22 m MV E/A ratio:  1.18       1.5       Systemic Diam: 2.00 cm  Serafina Royals MD Electronically signed by Serafina Royals MD Signature Date/Time: 04/21/2019/8:05:06 AM    Final      Assessment and Recommendation  63 y.o. male with hypertension hyperlipidemia having acute ST elevation myocardial infarction LV systolic dysfunction now improved symptomatic 1.  Dual antiplatelet therapy with aspirin and Brilinta knowing that Brilinta is giving him mild shortness of breath 2.  High intensity cholesterol therapy 3.  Metoprolol ACE inhibitor spironolactone for medication management of anterior myocardial infarction 4.  Isosorbide for concerns of distal right coronary artery PDA stenosis 5.  Continue ambulation and follow for improvements of symptoms and okay for discharge home from cardiac standpoint with further medication management and/or further intervention of distal PDA artery as necessary depending on symptoms and/or further work-up 6.  Call if further questions  Signed, Serafina Royals M.D. FACC

## 2020-05-17 ENCOUNTER — Ambulatory Visit
Admission: RE | Admit: 2020-05-17 | Discharge: 2020-05-17 | Disposition: A | Discharge status: Home / Self Care | Payer: Managed Care, Other (non HMO) | Source: Ambulatory Visit | Attending: Internal Medicine | Admitting: Internal Medicine

## 2020-05-17 ENCOUNTER — Other Ambulatory Visit: Payer: Self-pay | Admitting: Internal Medicine

## 2020-05-17 ENCOUNTER — Other Ambulatory Visit: Payer: Self-pay

## 2020-05-17 DIAGNOSIS — R109 Unspecified abdominal pain: Secondary | ICD-10-CM | POA: Diagnosis present

## 2020-05-17 DIAGNOSIS — N179 Acute kidney failure, unspecified: Secondary | ICD-10-CM | POA: Diagnosis present

## 2020-05-17 DIAGNOSIS — K661 Hemoperitoneum: Secondary | ICD-10-CM

## 2020-05-24 ENCOUNTER — Ambulatory Visit (INDEPENDENT_AMBULATORY_CARE_PROVIDER_SITE_OTHER): Payer: Managed Care, Other (non HMO) | Admitting: Urology

## 2020-05-24 ENCOUNTER — Other Ambulatory Visit: Payer: Self-pay

## 2020-05-24 ENCOUNTER — Encounter: Payer: Self-pay | Admitting: Urology

## 2020-05-24 VITALS — BP 136/79 | HR 63 | Ht 69.0 in | Wt 230.0 lb

## 2020-05-24 DIAGNOSIS — Z8739 Personal history of other diseases of the musculoskeletal system and connective tissue: Secondary | ICD-10-CM | POA: Diagnosis not present

## 2020-05-24 DIAGNOSIS — N201 Calculus of ureter: Secondary | ICD-10-CM | POA: Diagnosis not present

## 2020-05-24 DIAGNOSIS — N179 Acute kidney failure, unspecified: Secondary | ICD-10-CM | POA: Diagnosis not present

## 2020-05-24 DIAGNOSIS — N200 Calculus of kidney: Secondary | ICD-10-CM

## 2020-05-24 NOTE — Patient Instructions (Signed)

## 2020-05-24 NOTE — Progress Notes (Signed)
05/24/2020 12:55 PM   Dennis Peck 01-05-1956 [id number]  Referring provider: Rusty Aus, MD [facility] [place],  [place] [address]  Chief Complaint  Patient presents with  . Nephrolithiasis    HPI: 65 year old male referred for further evaluation of kidney stones.   He sustained a fall about a month ago in the bathroom hitting his left chest on the bathtub resulting in rib fracture.  He continued to have severe pain associated with this which was poorly controlled.  He had some difficulty taking a deep breath but in addition to this, and some left lower quadrant pain.  Ultimately, labs were checked and he was noted to have a rise in his creatinine to 2.0 on 05/15/2018.  In the setting of ongoing severe pain which radiated to his left lower quadrant along with a bump in the creatinine, a CT scan was ordered.  He underwent CT scan in the setting of his severe left flank pain found to have a 3 mm left UVJ stone.  In addition to this, he had a fairly large burden nonobstructing calculi, right greater than left.  On the right, he has a stone measuring 2.2 cm in largest diameter which is not particularly dense, around 500 Hounsfield units.  He also has some nonobstructing smaller left-sided lower pole stone burden.  He is not had any left lower quadrant pain for the past 3 to 4 days.  He had episode of severe pain just prior to this.  He recalls passing some clot-like material but never saw stone pass.  He is doing well today.  He denies a personal history of stones but his adult children have had kidney stones.  He does mention today that he is really been quite rigorous and following a cardiac diet in light of his history of heart attack.  He is cut out sodium in his intermittently good about drinking water.  He also mentions today that his medications have caused him to develop gout.  He is now on colchicine.  He does have chronic  low back pain has had back surgery.  The pain is midline.  Is exacerbated with activity, improved with stretching and NSAIDs.  No overt flank pain.  No history of gross hematuria.   PMH: Past Medical History:  Diagnosis Date  . Hypertension     Surgical History: Past Surgical History:  Procedure Laterality Date  . BACK SURGERY    . COLONOSCOPY N/A 10/17/2014   Procedure: COLONOSCOPY;  Surgeon: Josefine Class, MD;  Location: [facility] ENDOSCOPY;  Service: Endoscopy;  Laterality: N/A;  . CORONARY/GRAFT ACUTE MI REVASCULARIZATION N/A 04/20/2019   Procedure: Coronary/Graft Acute MI Revascularization;  Surgeon: Nelva Bush, MD;  Location: [place] CV LAB;  Service: Cardiovascular;  Laterality: N/A;  . LEFT HEART CATH AND CORONARY ANGIOGRAPHY N/A 04/20/2019   Procedure: LEFT HEART CATH AND CORONARY ANGIOGRAPHY;  Surgeon: Nelva Bush, MD;  Location: [place] [facility];  Service: Cardiovascular;  Laterality: N/A;    Home Medications:  Allergies as of 05/24/2020   No Known Allergies     Medication List       Accurate as of May 24, 2020 12:55 PM. If you have any questions, ask your nurse or doctor.        STOP taking these medications   geriatric multivitamins-minerals Elix Stopped by: Hollice Espy, MD   magnesium (amino acid chelate) 133 MG tablet Stopped by: Hollice Espy, MD  TAKE these medications   aspirin 81 MG chewable tablet Chew 1 tablet (81 mg total) by mouth daily.   atorvastatin 80 MG tablet Commonly known as: LIPITOR Take 1 tablet (80 mg total) by mouth daily at 6 PM.   clopidogrel 75 MG tablet Commonly known as: PLAVIX Take by mouth.   colchicine 0.6 MG tablet Take by mouth.   Cyanocobalamin 1000 MCG Subl Take by mouth.   Docusate Sodium 100 MG capsule Take by mouth.   etodolac 400 MG tablet Commonly known as: LODINE Take by mouth.   glucosamine-chondroitin 500-400 MG tablet Take 1 tablet by mouth 3 (three) times  daily.   isosorbide mononitrate 30 MG 24 hr tablet Commonly known as: IMDUR Take 1 tablet (30 mg total) by mouth daily.   KRILL OIL PO Take 100 mg by mouth daily.   metoprolol succinate 50 MG 24 hr tablet Commonly known as: TOPROL-XL Take 50 mg by mouth daily. Take with or immediately following a meal.   nitroGLYCERIN 0.4 MG SL tablet Commonly known as: NITROSTAT Place 1 tablet (0.4 mg total) under the tongue every 5 (five) minutes as needed for chest pain.   potassium chloride 10 MEQ tablet Commonly known as: KLOR-CON Take 10 mEq by mouth daily.   sertraline 50 MG tablet Commonly known as: ZOLOFT Take 50 mg by mouth daily.   spironolactone 25 MG tablet Commonly known as: ALDACTONE Take 1 tablet (25 mg total) by mouth daily.   tamsulosin 0.4 MG Caps capsule Commonly known as: FLOMAX Take by mouth.   telmisartan 40 MG tablet Commonly known as: MICARDIS Take 1 tablet (40 mg total) by mouth daily.   ticagrelor 90 MG Tabs tablet Commonly known as: BRILINTA Take 1 tablet (90 mg total) by mouth 2 (two) times daily.   traMADol 50 MG tablet Commonly known as: ULTRAM Take 50 mg by mouth 2 (two) times daily as needed.       Allergies: No Known Allergies  Family History: No family history on file.  Social History:  reports that he has never smoked. His smokeless tobacco use includes chew. He reports current alcohol use of about 28.0 standard drinks of alcohol per week. He reports that he does not use drugs.   Physical Exam: BP 136/79   Pulse 63   Ht 5\' 9"  (1.753 m)   Wt 230 lb (104.3 kg)   BMI 33.97 kg/m   Constitutional:  Alert and oriented, No acute distress. HEENT: [place] AT, moist mucus membranes.  Trachea midline, no masses. Cardiovascular: No clubbing, cyanosis, or edema. Respiratory: Normal respiratory effort, no increased work of breathing. Skin: No rashes, bruises or suspicious lesions. Neurologic: Grossly intact, no focal deficits, moving all 4  extremities. Psychiatric: Normal mood and affect.  Laboratory Data: Lab Results  Component Value Date   WBC 7.1 04/21/2019   HGB 12.5 (L) 04/21/2019   HCT 35.6 (L) 04/21/2019   MCV 84.2 04/21/2019   PLT 150 04/21/2019    Lab Results  Component Value Date   CREATININE 1.04 04/21/2019    Lab Results  Component Value Date   HGBA1C 7.3 (H) 04/20/2019    Urinalysis Urinalysis today without any evidence of microscopic blood.  Urinary pH 5.0.  Pertinent Imaging: CLINICAL DATA:  Status post fall.  Severe left flank pain  EXAM: CT ABDOMEN AND PELVIS WITHOUT CONTRAST  TECHNIQUE: Multidetector CT imaging of the abdomen and pelvis was performed following the standard protocol without IV contrast.  COMPARISON:  None.  FINDINGS:  Lower chest: No acute abnormality.  Hepatobiliary: No focal liver abnormality is seen. No gallstones, gallbladder wall thickening, or biliary dilatation.  Pancreas: Unremarkable. No pancreatic ductal dilatation or surrounding inflammatory changes.  Spleen: Normal in size without focal abnormality.  Adrenals/Urinary Tract: Normal appearance of the adrenal glands.  There is a large stone within the inferior pole collecting system of the right kidney measuring 1.7 cm, image 80/4. No right-sided hydronephrosis, hydroureter or ureteral lithiasis.  Cyst arising off the posterior cortex of the left mid kidney measures 4.3 cm. This is incompletely characterized without IV contrast.  Several small stones identified within the inferior pole collecting system of the left kidney. There is left-sided hydronephrosis and hydroureter with periureteral soft tissue stranding. Distal left ureteral calculi measures 3 mm, image 108/4. Within the urinary bladder in the region of the left UVJ there is a second stone which measures 3 mm, image 102/4.  Stomach/Bowel: Stomach is within normal limits. Appendix appears normal. No evidence of bowel wall  thickening, distention, or inflammatory changes.  Vascular/Lymphatic: Mild aortic atherosclerosis. No aneurysm. No abdominopelvic adenopathy identified.  Reproductive: Prostate is unremarkable.  Other: No free fluid or fluid collections.  Musculoskeletal: Status post posterior pedicle screw and rod fixation with interbody fusion of L4-5.  IMPRESSION: 1. Bilateral nephrolithiasis. Left-sided hydronephrosis and hydroureter secondary to 3 mm distal left ureteral calculi. A second small stone is noted at the left UVJ. 2. Acute left tenth rib fracture.  Aortic Atherosclerosis (ICD10-I70.0).   Electronically Signed   By: Kerby Moors M.D.   On: 05/17/2020 15:50  CT scan was personally reviewed.  Agree with radiologic interpretation although on upon my measurement, the right lower pole stone measures 2.3 cm.  Assessment & Plan:    1. Left ureteral calculus 3 mm left distal ureteral calculus  Statistically, this patient has an excellent chance of passing the stone.  Given his lack of ongoing microscopic hematuria, resolution of pain and passing small material which may have been with clot and/or stone debris, I suspect he has passed the stone.  I do not feel that further imaging is necessary unless he develops recurrent discomfort.  2. Kidney stones We discussed his overall stone burden, right significantly greater than left  Unclear timeline of when the stones were developed as well as stone composition  We did have an extensive conversation today about dietary risk factors.  Primarily encourage hydration.  Given his strong history of gout, acidic urine as well as fairly large stone burden was not particularly dense, suspect that this may be uric acid stone.  He is undergoing 13-KGMW urine metabolic evaluation to help guide his dietary/behavioral intervention.  If this in fact is uric acid stone disease, he may benefit from alkalinization with potassium citrate.  We  will discuss further at his follow-up.  Also plan for KUB at his follow-up to assess whether the stones are radiolucent versus opaque. - Urinalysis, Complete - Abdomen 1 view (KUB); Future  3. Acute kidney injury ([place]) Likely secondary to NSAID use as well as obstructing stone  He is no longer taking NSAIDs and is likely passed the stone.  Recommend repeat creatinine with his next lab draw.  4. History of gout As above   Follow-up in 6 weeks with 10-UVOZ urine metabolic evaluation/KUB  Hollice Espy, MD  [facility] [address] [place], [place] [address] [phone]  I spent 45 total minutes on the day of the encounter including pre-visit review of the  medical record, face-to-face time with the patient, and post visit ordering of labs/imaging/tests.  CT scan was personally reviewed.  I also spent a fair amount of time counseling the patient.  He was given information about purine diet today.

## 2020-05-25 LAB — URINALYSIS, COMPLETE
Bilirubin, UA: NEGATIVE
Glucose, UA: NEGATIVE
Ketones, UA: NEGATIVE
Leukocytes,UA: NEGATIVE
Nitrite, UA: NEGATIVE
Specific Gravity, UA: 1.02 (ref 1.005–1.030)
Urobilinogen, Ur: 0.2 mg/dL (ref 0.2–1.0)
pH, UA: 5 (ref 5.0–7.5)

## 2020-05-25 LAB — MICROSCOPIC EXAMINATION: Bacteria, UA: NONE SEEN

## 2020-06-21 ENCOUNTER — Other Ambulatory Visit: Payer: Managed Care, Other (non HMO)

## 2020-06-21 ENCOUNTER — Other Ambulatory Visit: Payer: Self-pay

## 2020-06-26 ENCOUNTER — Other Ambulatory Visit: Payer: Self-pay | Admitting: Urology

## 2020-07-12 ENCOUNTER — Ambulatory Visit
Admission: RE | Admit: 2020-07-12 | Discharge: 2020-07-12 | Disposition: A | Discharge status: Home / Self Care | Payer: Managed Care, Other (non HMO) | Attending: Urology | Admitting: Urology

## 2020-07-12 ENCOUNTER — Encounter: Payer: Self-pay | Admitting: Urology

## 2020-07-12 ENCOUNTER — Ambulatory Visit (INDEPENDENT_AMBULATORY_CARE_PROVIDER_SITE_OTHER): Payer: Managed Care, Other (non HMO) | Admitting: Urology

## 2020-07-12 ENCOUNTER — Ambulatory Visit
Admission: RE | Admit: 2020-07-12 | Discharge: 2020-07-12 | Disposition: A | Discharge status: Home / Self Care | Payer: Managed Care, Other (non HMO) | Source: Ambulatory Visit | Attending: Urology | Admitting: Urology

## 2020-07-12 ENCOUNTER — Other Ambulatory Visit: Payer: Self-pay

## 2020-07-12 VITALS — BP 145/86 | HR 82

## 2020-07-12 DIAGNOSIS — N201 Calculus of ureter: Secondary | ICD-10-CM

## 2020-07-12 DIAGNOSIS — N200 Calculus of kidney: Secondary | ICD-10-CM | POA: Insufficient documentation

## 2020-07-12 DIAGNOSIS — N179 Acute kidney failure, unspecified: Secondary | ICD-10-CM | POA: Diagnosis not present

## 2020-07-12 DIAGNOSIS — Z8739 Personal history of other diseases of the musculoskeletal system and connective tissue: Secondary | ICD-10-CM | POA: Diagnosis not present

## 2020-07-12 MED ORDER — POTASSIUM BICARB-CITRIC ACID 20 MEQ PO TBEF: 20.0000 meq | EFFERVESCENT_TABLET | Freq: Three times a day (TID) | ORAL | 3 refills | Status: DC

## 2020-07-12 NOTE — Progress Notes (Signed)
07/12/2020 11:42 AM   Dennis Peck 10-07-1955 [id number]  Referring provider: Rusty Aus, MD [facility] [place],  [place] [address]  Chief Complaint  Patient presents with  . Nephrolithiasis    HPI: 65 year old male who returns today for follow-up of kidney stones.  Since last visit, his past several other small stones.  He did not bring them in today.  He denies any flank pain today.  KUB today shows bilateral stone disease, right greater than left primary lower pole.   He returns today with a 24-hour urine.  This shows an excellent urinary volume of 2.98 L/day.  His urinary citrate was low at 176 mg/day.  Urinary calcium was low 96 mg/day.  Urinary oxalate levels moderate 39 mg/day.  Urinary pH was very low at 4.954.  Interpretation indicates that he has borderline hyperoxaluria, marked hypocitrus urea very low urinary pH and a high uric acid supersaturation.   [place] study was personally reviewed today in entirety and a copy was provided to the patient.  He notes that he is currently taking over-the-counter potassium chloride pills.   PMH: Past Medical History:  Diagnosis Date  . Hypertension     Surgical History: Past Surgical History:  Procedure Laterality Date  . BACK SURGERY    . COLONOSCOPY N/A 10/17/2014   Procedure: COLONOSCOPY;  Surgeon: Josefine Class, MD;  Location: [facility] ENDOSCOPY;  Service: Endoscopy;  Laterality: N/A;  . CORONARY/GRAFT ACUTE MI REVASCULARIZATION N/A 04/20/2019   Procedure: Coronary/Graft Acute MI Revascularization;  Surgeon: Nelva Bush, MD;  Location: [place] CV LAB;  Service: Cardiovascular;  Laterality: N/A;  . LEFT HEART CATH AND CORONARY ANGIOGRAPHY N/A 04/20/2019   Procedure: LEFT HEART CATH AND CORONARY ANGIOGRAPHY;  Surgeon: Nelva Bush, MD;  Location: [place] CV LAB;  Service: Cardiovascular;  Laterality: N/A;    Home Medications:  Allergies as  of 07/12/2020   No Known Allergies     Medication List       Accurate as of July 12, 2020 11:42 AM. If you have any questions, ask your nurse or doctor.        STOP taking these medications   clopidogrel 75 MG tablet Commonly known as: PLAVIX Stopped by: Hollice Espy, MD   isosorbide mononitrate 30 MG 24 hr tablet Commonly known as: IMDUR Stopped by: Hollice Espy, MD   spironolactone 25 MG tablet Commonly known as: ALDACTONE Stopped by: Hollice Espy, MD   ticagrelor 90 MG Tabs tablet Commonly known as: BRILINTA Stopped by: Hollice Espy, MD     TAKE these medications   aspirin 81 MG chewable tablet Chew 1 tablet (81 mg total) by mouth daily.   atorvastatin 80 MG tablet Commonly known as: LIPITOR Take 1 tablet (80 mg total) by mouth daily at 6 PM.   colchicine 0.6 MG tablet Take by mouth.   Cyanocobalamin 1000 MCG Subl Take by mouth.   Docusate Sodium 100 MG capsule Take by mouth.   etodolac 400 MG tablet Commonly known as: LODINE Take by mouth.   glucosamine-chondroitin 500-400 MG tablet Take 1 tablet by mouth 3 (three) times daily.   KRILL OIL PO Take 100 mg by mouth daily.   metoprolol succinate 50 MG 24 hr tablet Commonly known as: TOPROL-XL Take 50 mg by mouth daily. Take with or immediately following a meal.   nitroGLYCERIN 0.4 MG SL tablet Commonly known as: NITROSTAT Place 1 tablet (0.4 mg total) under the tongue every 5 (  five) minutes as needed for chest pain.   potassium chloride 10 MEQ tablet Commonly known as: KLOR-CON Take 10 mEq by mouth daily.   sertraline 50 MG tablet Commonly known as: ZOLOFT Take 50 mg by mouth daily.   tamsulosin 0.4 MG Caps capsule Commonly known as: FLOMAX Take by mouth.   telmisartan 40 MG tablet Commonly known as: MICARDIS Take 1 tablet (40 mg total) by mouth daily.   traMADol 50 MG tablet Commonly known as: ULTRAM Take 50 mg by mouth 2 (two) times daily as needed.       Allergies: No  Known Allergies  Family History: No family history on file.  Social History:  reports that he has never smoked. His smokeless tobacco use includes chew. He reports current alcohol use of about 28.0 standard drinks of alcohol per week. He reports that he does not use drugs.   Physical Exam: BP (!) 145/86   Pulse 82   Constitutional:  Alert and oriented, No acute distress. HEENT: [place] AT, moist mucus membranes.  Trachea midline, no masses. Cardiovascular: No clubbing, cyanosis, or edema. Respiratory: Normal respiratory effort, no increased work of breathing.. Skin: No rashes, bruises or suspicious lesions. Neurologic: Grossly intact, no focal deficits, moving all 4 extremities. Psychiatric: Normal mood and affect.   Pertinent Imaging: Narrative & Impression  CLINICAL DATA:  Kidney stones.  EXAM: ABDOMEN - 1 VIEW  COMPARISON:  May 17, 2020.  FINDINGS: The bowel gas pattern is normal. Large calculus is seen projected over the lower pole of the right kidney. Probable phleboliths seen in the right side of the pelvis.  IMPRESSION: Large right renal calculus. No evidence of bowel obstruction or ileus.   Electronically Signed   By: Marijo Conception M.D.   On: 07/13/2020 14:24      Assessment & Plan:    1. Bilateral nephrolithiasis Bilateral nonobstructing stone burden, right greater than left   Overall good urine output but he does have significant metabolic derangements including extremely acidic urine, very low urinary citrate and personal history of gout all indicative that these may be uric acid stones.  I recommended potassium citrate for more aggressive urinary acidification, will start with 20 mEq 3 times a day.  He can stop taking his potassium chloride in the interim.  We will recheck labs in 3 weeks to ensure that he does not have hyperkalemia.  Consider repeat interval Litholink study as well as KUB to assess overall stone burden and efficacy of  urinary acidification.  If he is able to bring in the stone, advised him to bring it to consent for stone analysis.  Warning symptoms reviewed for acute stone episode  - Urinalysis, Complete; Future - Basic metabolic panel; Future - Abdomen 1 view (KUB); Future  2. Acute kidney injury ([place]) Creatinine normalized after interval passage of ureteral calculus on Litholink labs  3. History of gout Literature given today for low purine diet   Follow-up for labs in 2 weeks and KUB in 6 months  Hollice Espy, MD  [place] [address] [place], [place] [address] [phone]

## 2020-07-13 ENCOUNTER — Other Ambulatory Visit: Payer: Self-pay

## 2020-07-13 ENCOUNTER — Telehealth: Payer: Self-pay | Admitting: *Deleted

## 2020-07-13 NOTE — Telephone Encounter (Addendum)
Patient informed, voiced understanding.   ----- Message from Hollice Espy, MD sent at 07/13/2020  3:21 PM EDT ----- Kidney stone is seen on KUB.  It measures about 7 mm.  We will likely just continue to follow this for the time being.  Hollice Espy, MD

## 2020-07-26 ENCOUNTER — Other Ambulatory Visit: Payer: Self-pay

## 2020-07-26 ENCOUNTER — Other Ambulatory Visit: Payer: Managed Care, Other (non HMO)

## 2020-07-26 DIAGNOSIS — N200 Calculus of kidney: Secondary | ICD-10-CM

## 2020-07-27 ENCOUNTER — Telehealth: Payer: Self-pay | Admitting: *Deleted

## 2020-07-27 LAB — BASIC METABOLIC PANEL
BUN/Creatinine Ratio: 33 — ABNORMAL HIGH (ref 10–24)
BUN: 40 mg/dL — ABNORMAL HIGH (ref 8–27)
CO2: 20 mmol/L (ref 20–29)
Calcium: 9.5 mg/dL (ref 8.6–10.2)
Chloride: 103 mmol/L (ref 96–106)
Creatinine, Ser: 1.22 mg/dL (ref 0.76–1.27)
Glucose: 146 mg/dL — ABNORMAL HIGH (ref 65–99)
Potassium: 4.4 mmol/L (ref 3.5–5.2)
Sodium: 143 mmol/L (ref 134–144)
eGFR: 67 mL/min/{1.73_m2} (ref >59)

## 2020-07-27 LAB — MICROSCOPIC EXAMINATION: Bacteria, UA: NONE SEEN

## 2020-07-27 LAB — URINALYSIS, COMPLETE
Bilirubin, UA: NEGATIVE
Glucose, UA: NEGATIVE
Ketones, UA: NEGATIVE
Leukocytes,UA: NEGATIVE
Nitrite, UA: NEGATIVE
Specific Gravity, UA: 1.02 (ref 1.005–1.030)
Urobilinogen, Ur: 0.2 mg/dL (ref 0.2–1.0)
pH, UA: 5 (ref 5.0–7.5)

## 2020-07-27 NOTE — Telephone Encounter (Addendum)
Patient informed, reviewed in detail. Verbalized understanding.   ----- Message from Hollice Espy, MD sent at 07/27/2020  7:59 AM EDT ----- Potasium is fine on this dose.  Urinary pH is still quite acidic.  We may end up adding some bicarb down the road, will continue to monitor.    Hollice Espy, MD

## 2020-12-18 ENCOUNTER — Other Ambulatory Visit: Payer: Self-pay | Admitting: Urology

## 2021-01-15 NOTE — Progress Notes (Signed)
01/16/21 12:01 PM   Dennis Peck Jun 26, 1955 [id number]  Referring provider:  Rusty Aus, MD [facility] [place],  [place] [address] Chief Complaint  Patient presents with   Nephrolithiasis     HPI: Dennis Peck is a 65 y.o.male with a personal history of kidney stones who presents today for follow-up with KUB.   His 24-hour urine on 06/21/2020 he is on  potassium citrate 20 mg mEq tid.  His potassium rechecked and is within normal limits.  Is been tolerating this medication but is worried about the cost, but $65 per month which is expensive for him.  KUB showed a stable calcification and a stable right lower pole unchanged since march    He reports that he is doing well today with no new urinary symptoms. His gout has caused him to be unable to walk sometimes.   He reports difficulty getting an erection as well as low ejaculatory volume.  He has been taking Flomax continuously even though this was prescribed for acute stone episode.    PMH: Past Medical History:  Diagnosis Date   Hypertension     Surgical History: Past Surgical History:  Procedure Laterality Date   BACK SURGERY     COLONOSCOPY N/A 10/17/2014   Procedure: COLONOSCOPY;  Surgeon: Josefine Class, MD;  Location: [facility] ENDOSCOPY;  Service: Endoscopy;  Laterality: N/A;   CORONARY/GRAFT ACUTE MI REVASCULARIZATION N/A 04/20/2019   Procedure: Coronary/Graft Acute MI Revascularization;  Surgeon: Nelva Bush, MD;  Location: [place] CV LAB;  Service: Cardiovascular;  Laterality: N/A;   LEFT HEART CATH AND CORONARY ANGIOGRAPHY N/A 04/20/2019   Procedure: LEFT HEART CATH AND CORONARY ANGIOGRAPHY;  Surgeon: Nelva Bush, MD;  Location: [place] CV LAB;  Service: Cardiovascular;  Laterality: N/A;    Home Medications:  Allergies as of 01/16/2021   No Known Allergies      Medication List        Accurate as of January 16, 2021  12:01 PM. If you have any questions, ask your nurse or doctor.          aspirin 81 MG chewable tablet Chew 1 tablet (81 mg total) by mouth daily.   atorvastatin 80 MG tablet Commonly known as: LIPITOR Take 1 tablet (80 mg total) by mouth daily at 6 PM. What changed: additional instructions   colchicine 0.6 MG tablet Take by mouth.   Cyanocobalamin 1000 MCG Subl Take by mouth.   Docusate Sodium 100 MG capsule Take by mouth.   Effer-K 20 MEQ Tbef Generic drug: Potassium Bicarb-Citric Acid TAKE 1 TABLET BY MOUTH 3 TIMES DAILY.   etodolac 400 MG tablet Commonly known as: LODINE Take by mouth.   glucosamine-chondroitin 500-400 MG tablet Take 1 tablet by mouth 3 (three) times daily.   KRILL OIL PO Take 100 mg by mouth daily.   metoprolol succinate 50 MG 24 hr tablet Commonly known as: TOPROL-XL Take 50 mg by mouth daily. Take with or immediately following a meal.   nitroGLYCERIN 0.4 MG SL tablet Commonly known as: NITROSTAT Place 1 tablet (0.4 mg total) under the tongue every 5 (five) minutes as needed for chest pain.   sertraline 50 MG tablet Commonly known as: ZOLOFT Take 50 mg by mouth daily.   tamsulosin 0.4 MG Caps capsule Commonly known as: FLOMAX Take by mouth.   telmisartan 40 MG tablet Commonly known as: MICARDIS Take 1 tablet (40 mg total) by mouth daily.  traMADol 50 MG tablet Commonly known as: ULTRAM Take 50 mg by mouth 2 (two) times daily as needed.        Allergies: No Known Allergies  Family History: No family history on file.  Social History:  reports that he has never smoked. His smokeless tobacco use includes chew. He reports current alcohol use of about 28.0 standard drinks per week. He reports that he does not use drugs.   Physical Exam: BP (!) 130/91   Pulse 73   Ht 5\' 9"  (1.753 m)   Wt 230 lb (104.3 kg)   BMI 33.97 kg/m   Constitutional:  Alert and oriented, No acute distress. HEENT: [place] AT, moist mucus membranes.   Trachea midline, no masses. Cardiovascular: No clubbing, cyanosis, or edema. Respiratory: Normal respiratory effort, no increased work of breathing. Skin: No rashes, bruises or suspicious lesions. Neurologic: Grossly intact, no focal deficits, moving all 4 extremities. Psychiatric: Normal mood and affect.  Laboratory Data:  Lab Results  Component Value Date   CREATININE 1.22 07/26/2020    Lab Results  Component Value Date   HGBA1C 7.3 (H) 04/20/2019     Pertinent Imaging: KUB personally reviewed today.  Final radiologic interpretation is pending but is unchanged from KUB 6 months ago.  Compared to CT in January 2022, stones or not readily visible especially in the left side.   Assessment & Plan:    Recurrent stone  - KUB reviewed today is stable, based on comparison to previous CT abdomen from 1/22, stones are likely uric acid and unable to be visualized on this particular study - Still on potassium citrate but concern for cost; alternative of litholite offered may want to check labs and urine to make sure dose is correct -We will hold off on further imaging, consider repeat KUB in 2 years although this is not the most productive imaging modality as suspect stones likely uric acid based on comparison obtain CT/KUB today -Hold Flomax only for acute stone episodes, changed labile on medication today to prevent confusion  2. Erectile dysfunction/ Low ejaculation rate  - Stop tamsulosin unless you are having a stone episode.  - Sildenafil prescribed today; he has taken this in the past.  He does have a prescription for nitroglycerin which he got several years ago after an MI but is never used this medication.  We had a very lengthy conversation today about the risks of taking nitroglycerin to combine together with sildenafil.  He has agreed to throw away this prescription and will let his cardiologist know.  Additionally, if he does have an MI, he will notify EMS and has been taking  sildenafil.   I,Kailey Littlejohn,acting as a Education administrator for Hollice Espy, MD.,have documented all relevant documentation on the behalf of Hollice Espy, MD,as directed by  Hollice Espy, MD while in the presence of Hollice Espy, MD.  I have reviewed the above documentation for accuracy and completeness, and I agree with the above.   Hollice Espy, MD   [facility] [address] [place], [place] [address] [phone]

## 2021-01-16 ENCOUNTER — Ambulatory Visit
Admission: RE | Admit: 2021-01-16 | Discharge: 2021-01-16 | Disposition: A | Discharge status: Home / Self Care | Payer: Managed Care, Other (non HMO) | Source: Ambulatory Visit | Attending: Urology | Admitting: Urology

## 2021-01-16 ENCOUNTER — Encounter: Payer: Self-pay | Admitting: Urology

## 2021-01-16 ENCOUNTER — Other Ambulatory Visit: Payer: Self-pay

## 2021-01-16 ENCOUNTER — Ambulatory Visit (INDEPENDENT_AMBULATORY_CARE_PROVIDER_SITE_OTHER): Payer: Managed Care, Other (non HMO) | Admitting: Urology

## 2021-01-16 VITALS — BP 130/91 | HR 73 | Ht 69.0 in | Wt 230.0 lb

## 2021-01-16 DIAGNOSIS — N200 Calculus of kidney: Secondary | ICD-10-CM

## 2021-01-16 DIAGNOSIS — Z8739 Personal history of other diseases of the musculoskeletal system and connective tissue: Secondary | ICD-10-CM

## 2021-01-16 DIAGNOSIS — N5203 Combined arterial insufficiency and corporo-venous occlusive erectile dysfunction: Secondary | ICD-10-CM

## 2021-01-16 MED ORDER — SILDENAFIL CITRATE 20 MG PO TABS: ORAL_TABLET | ORAL | 0 refills | Status: AC

## 2021-01-25 ENCOUNTER — Other Ambulatory Visit: Payer: Self-pay | Admitting: Urology

## 2022-01-17 ENCOUNTER — Ambulatory Visit: Payer: Self-pay | Admitting: Physician Assistant

## 2022-06-04 IMAGING — CT CT ABD-PELV W/O CM
2 of 4 series · 16 of 46 positions shown, 18 images · non-contrast
Comparison: None.

CLINICAL DATA: Status post fall.  Severe left flank pain

EXAM:
CT ABDOMEN AND PELVIS WITHOUT CONTRAST
TECHNIQUE: Multidetector CT imaging of the abdomen and pelvis was performed
following the standard protocol without IV contrast.

[Series 2: axials routine abdomen pelvis without (person_name · axial · non-contrast · 0.77mm/px · z∈[-1515,-1105]mm · 13 of 90 slices shown, 15 images]
[im 4/90  soft-tissue]
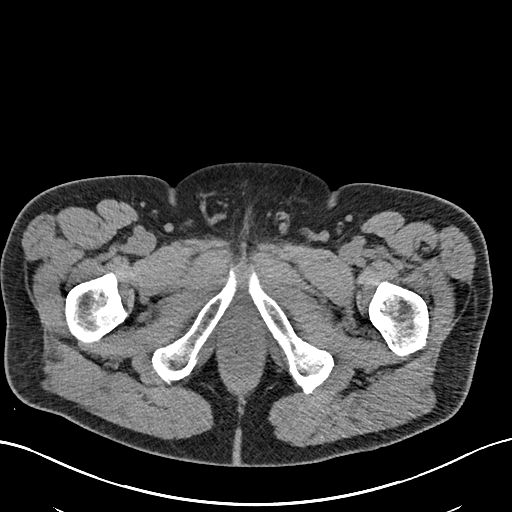
[im 4/90  bone]
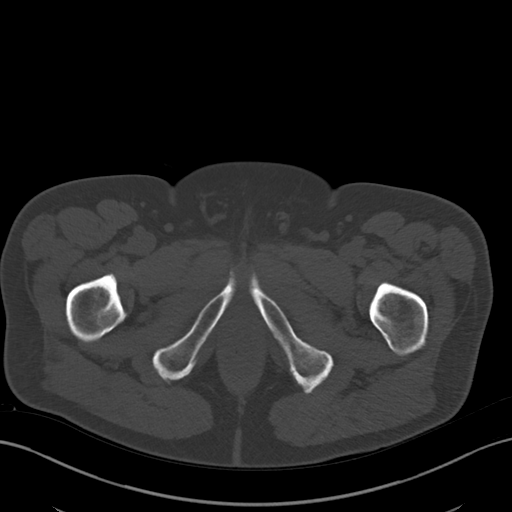
[im 12/90  soft-tissue]
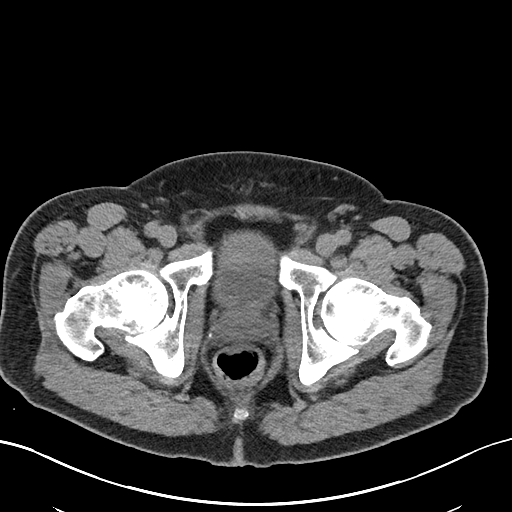
[im 19/90  soft-tissue]
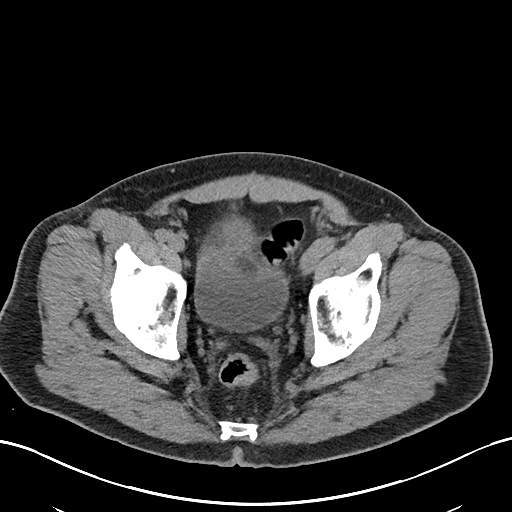
[im 26/90  soft-tissue]
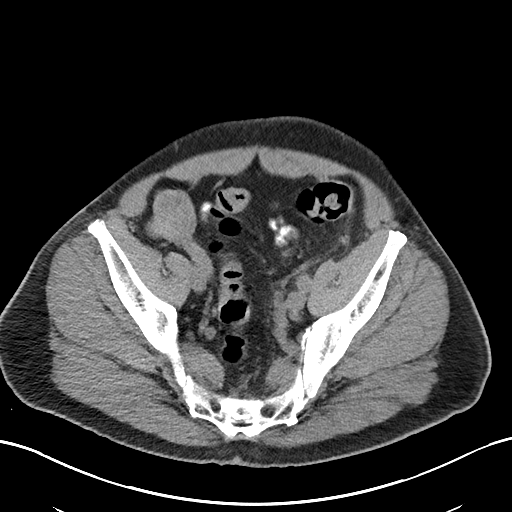
[im 30/90  soft-tissue]
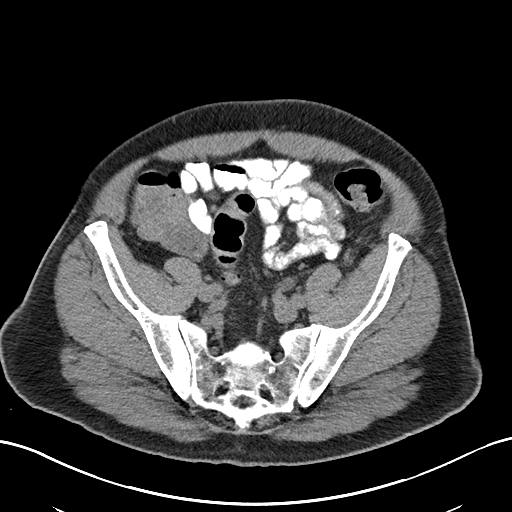
[im 38/90  soft-tissue]
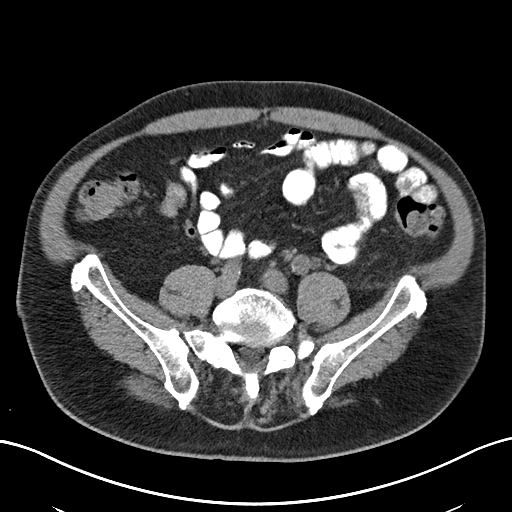
[im 45/90  soft-tissue]
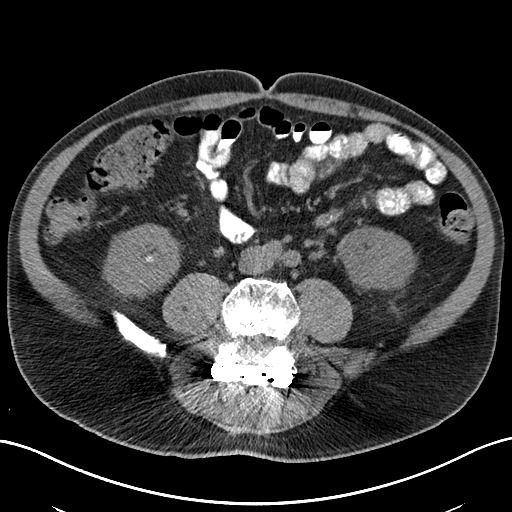
[im 52/90  soft-tissue]
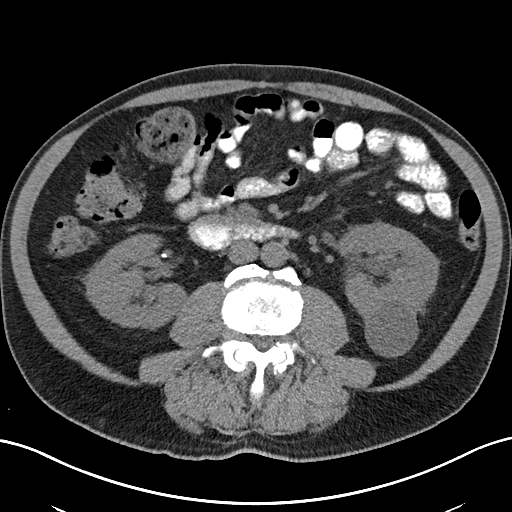
[im 60/90  soft-tissue]
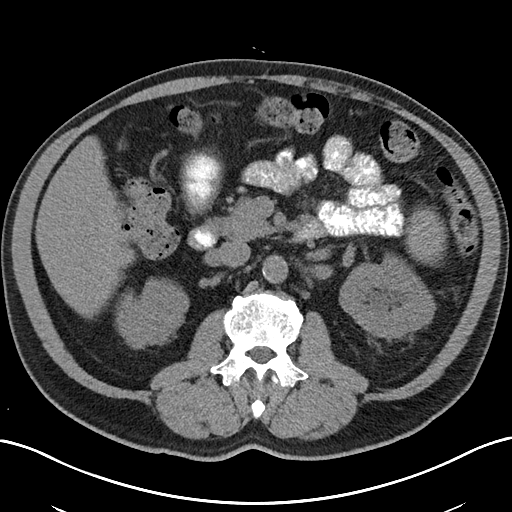
[im 60/90  bone]
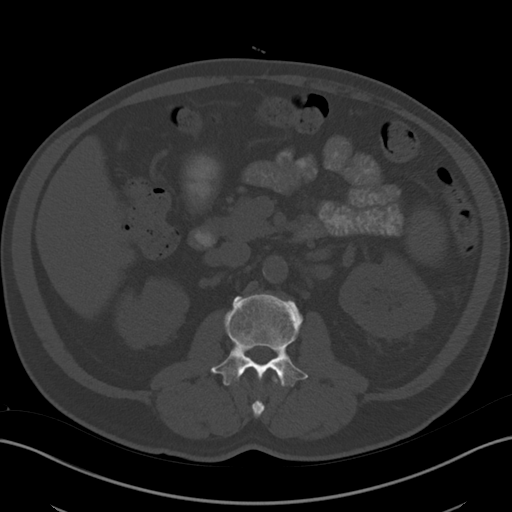
[im 64/90  soft-tissue]
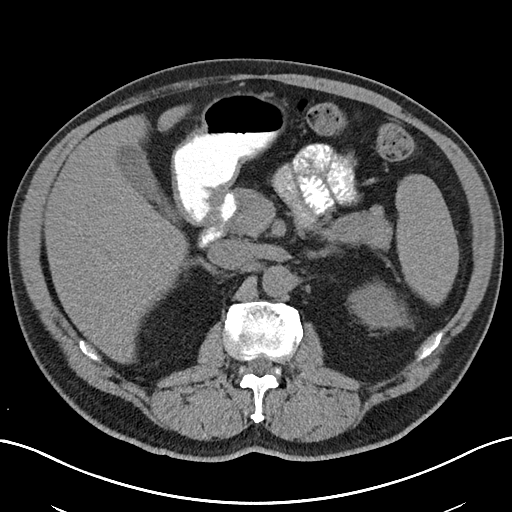
[im 71/90  soft-tissue]
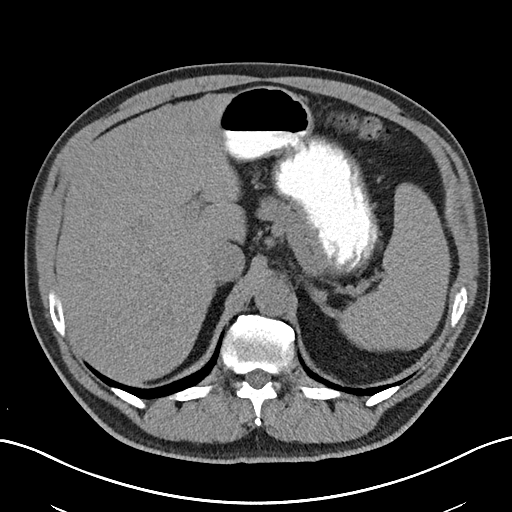
[im 78/90  soft-tissue]
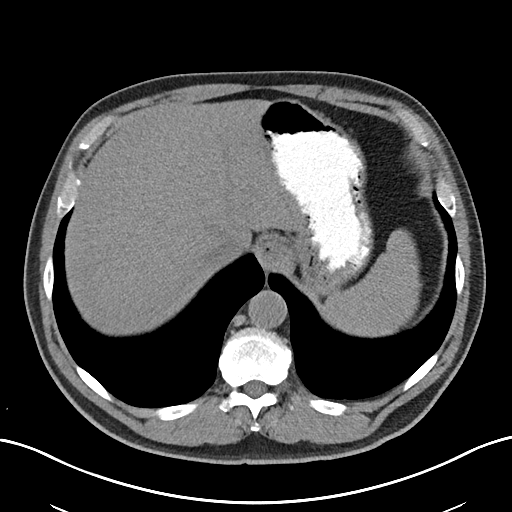
[im 86/90  soft-tissue]
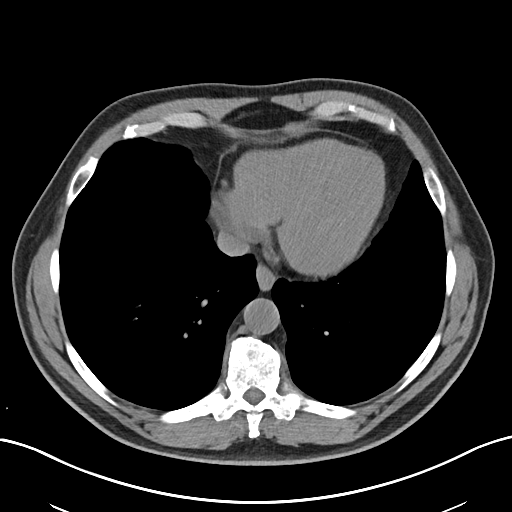

[Series 4: coronals routine abdomen pelvis without (person_na · coronal · non-contrast · 0.77mm/px · 3 of 161 slices shown]
[im 54/161  soft-tissue]
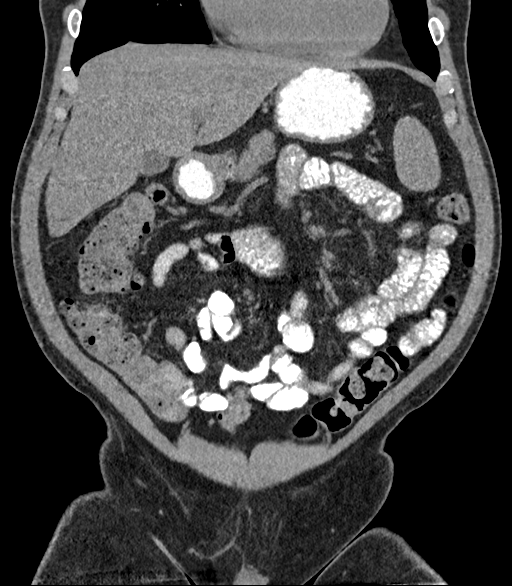
[im 72/161  soft-tissue]
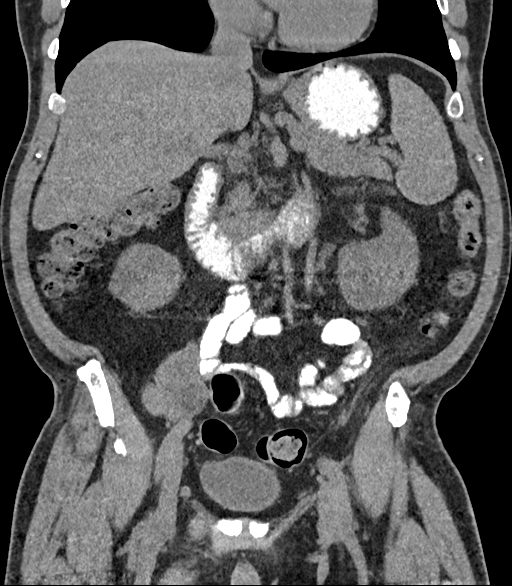
[im 89/161  soft-tissue]
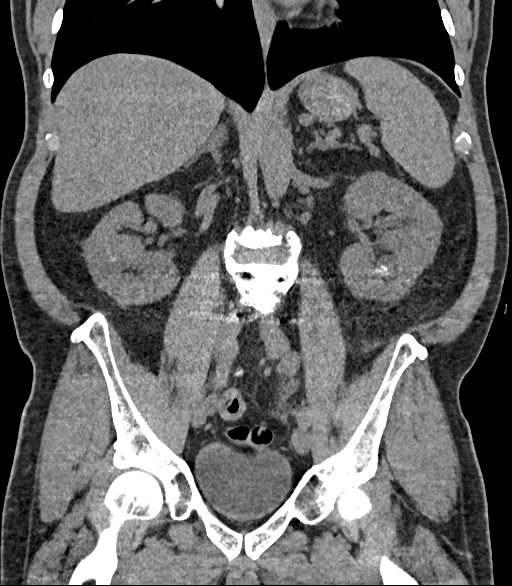

[16 of 46 positions shown; findings below may reference images not displayed]

FINDINGS: Lower chest: No acute abnormality.

Hepatobiliary: No focal liver abnormality is seen. No gallstones,
gallbladder wall thickening, or biliary dilatation.

Pancreas: Unremarkable. No pancreatic ductal dilatation or
surrounding inflammatory changes.

Spleen: Normal in size without focal abnormality.

Adrenals/Urinary Tract: Normal appearance of the adrenal glands.

There is a large stone within the inferior pole collecting system of
the right kidney measuring 1.7 cm, image 80/4. No right-sided
hydronephrosis, hydroureter or ureteral lithiasis.

Cyst arising off the posterior cortex of the left mid kidney
measures 4.3 cm. This is incompletely characterized without IV
contrast.

Several small stones identified within the inferior pole collecting
system of the left kidney. There is left-sided hydronephrosis and
hydroureter with periureteral soft tissue stranding. Distal left
ureteral calculi measures 3 mm, image 108/4. Within the urinary
bladder in the region of the left UVJ there is a second stone which
measures 3 mm, image 102/4.

Stomach/Bowel: Stomach is within normal limits. Appendix appears
normal. No evidence of bowel wall thickening, distention, or
inflammatory changes.

Vascular/Lymphatic: Mild aortic atherosclerosis. No aneurysm. No
abdominopelvic adenopathy identified.

Reproductive: Prostate is unremarkable.

Other: No free fluid or fluid collections.

Musculoskeletal: Status post posterior pedicle screw and rod
fixation with interbody fusion of L4-5.
IMPRESSION: 1. Bilateral nephrolithiasis. Left-sided hydronephrosis and
hydroureter secondary to 3 mm distal left ureteral calculi. A second
small stone is noted at the left UVJ.
2. Acute left tenth rib fracture.

Aortic Atherosclerosis ([id number]-WOQ.Q).

## 2022-07-30 IMAGING — CR DG ABDOMEN 1V
1 series · 2 of 2 positions shown · non-contrast
Comparison: May 17, 2020.

CLINICAL DATA: Kidney stones.

EXAM:
ABDOMEN - 1 VIEW

[Series 1: dg abd 1 view · 0.14mm/px · 2 of 2 slices shown]
[im 1/2]
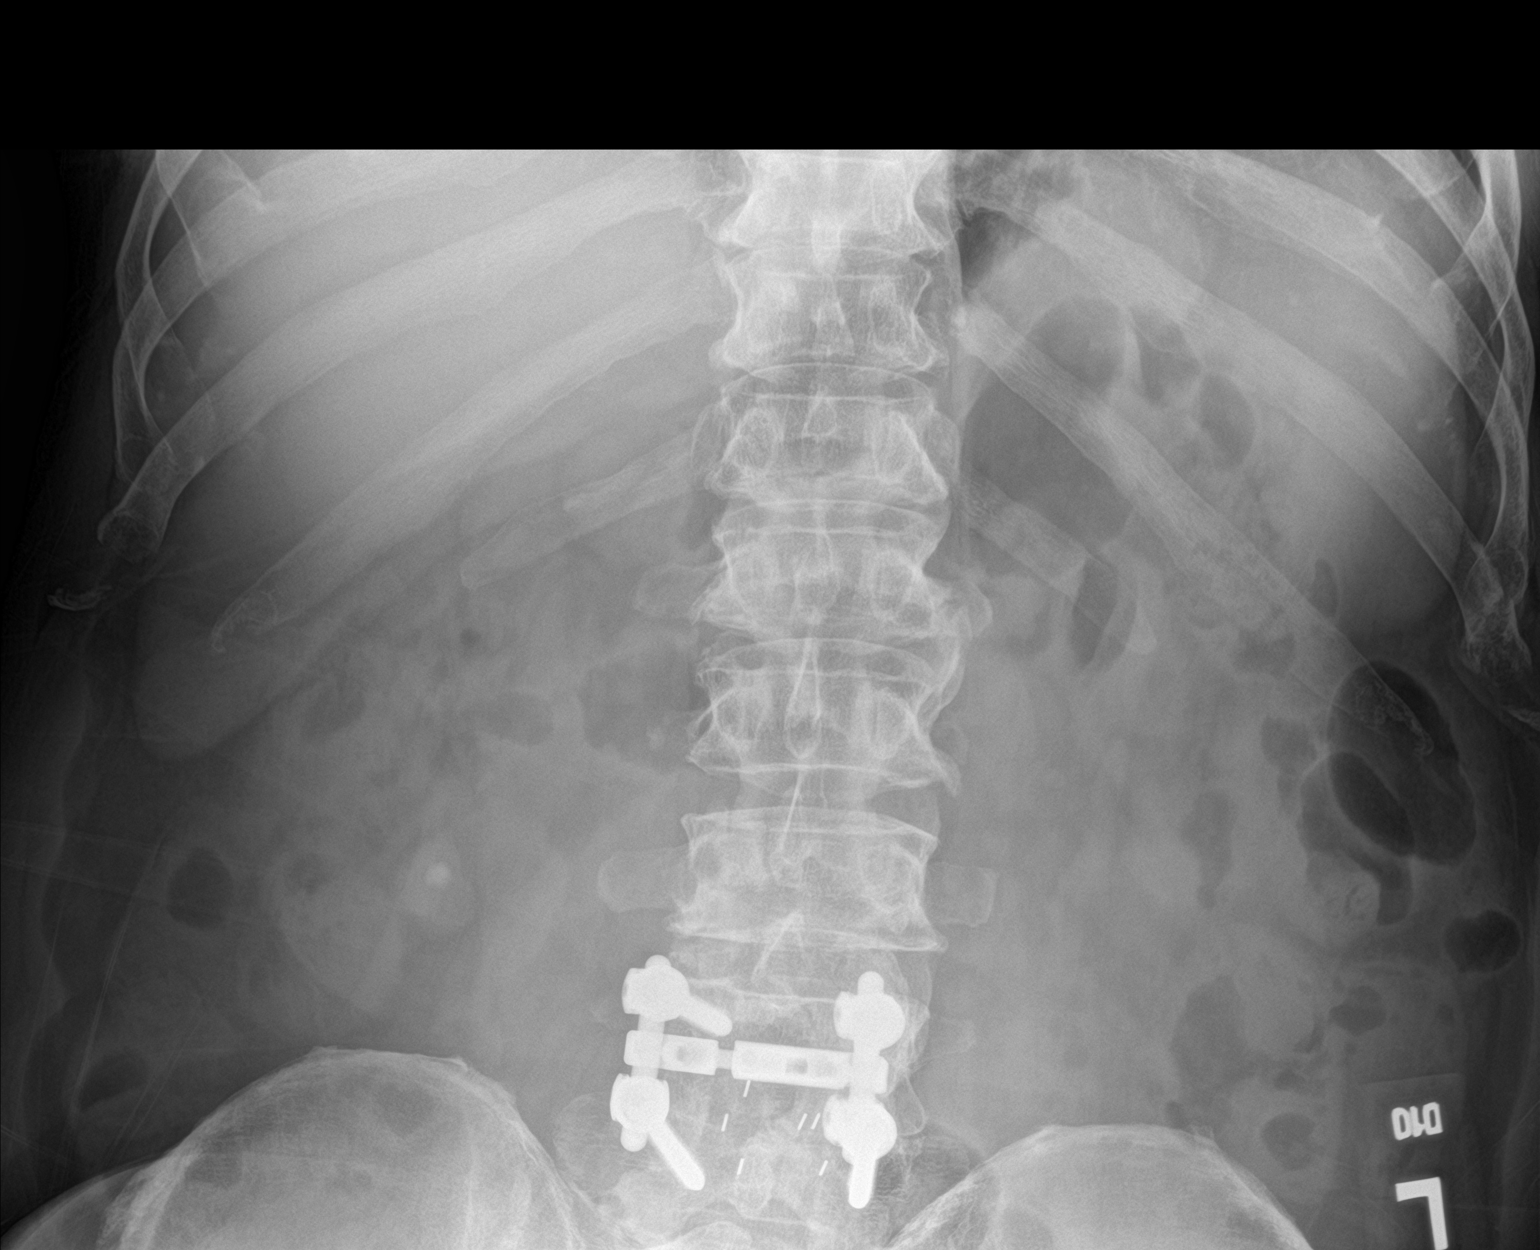
[im 2/2]
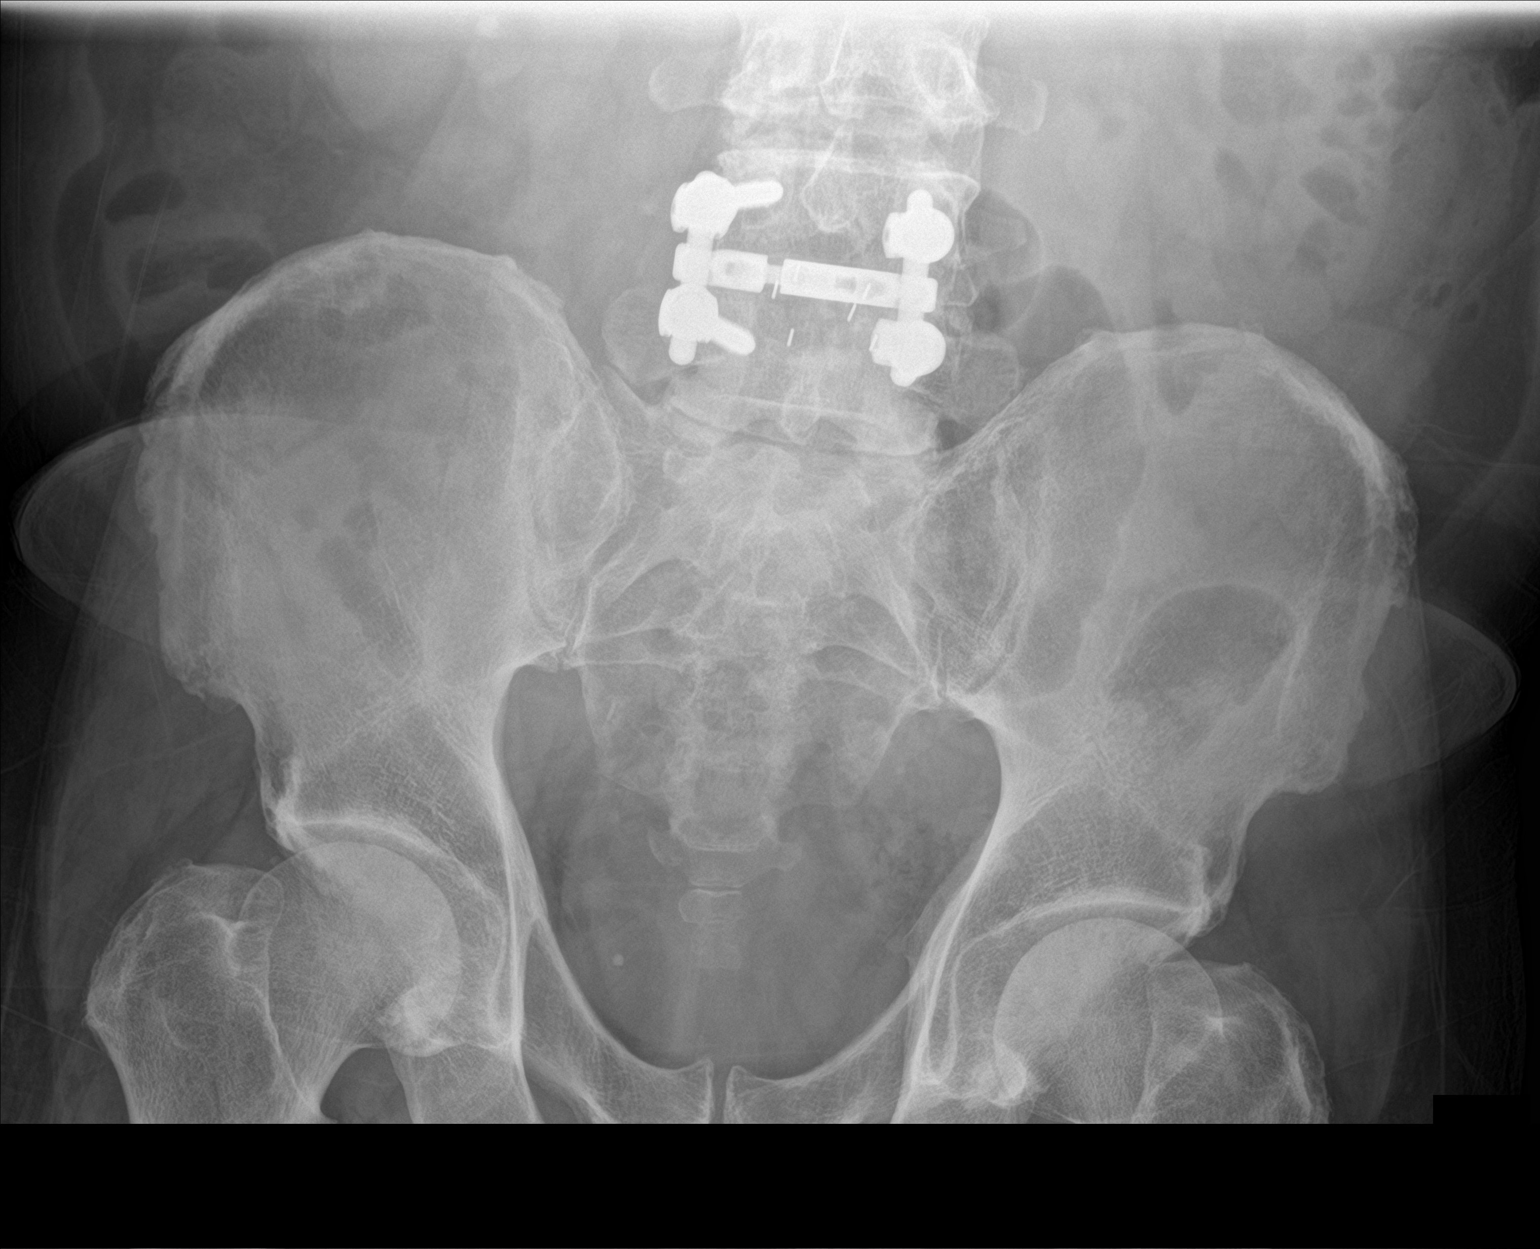

[2 of 2 positions shown; findings below may reference images not displayed]

FINDINGS: The bowel gas pattern is normal. Large calculus is seen projected
over the lower pole of the right kidney. Probable phleboliths seen
in the right side of the pelvis.
IMPRESSION: Large right renal calculus. No evidence of bowel obstruction or
ileus.

## 2022-09-24 ENCOUNTER — Encounter: Payer: Self-pay | Admitting: Oncology

## 2022-09-24 ENCOUNTER — Inpatient Hospital Stay: Payer: Medicare Other | Attending: Oncology | Admitting: Oncology

## 2022-09-24 ENCOUNTER — Inpatient Hospital Stay: Payer: Medicare Other

## 2022-09-24 VITALS — BP 127/87 | HR 70 | Temp 98.5°F | Resp 16 | Ht 69.0 in | Wt 216.0 lb

## 2022-09-24 DIAGNOSIS — Z87891 Personal history of nicotine dependence: Secondary | ICD-10-CM | POA: Insufficient documentation

## 2022-09-24 DIAGNOSIS — D472 Monoclonal gammopathy: Secondary | ICD-10-CM

## 2022-09-24 DIAGNOSIS — Z87442 Personal history of urinary calculi: Secondary | ICD-10-CM | POA: Insufficient documentation

## 2022-09-24 DIAGNOSIS — I252 Old myocardial infarction: Secondary | ICD-10-CM | POA: Insufficient documentation

## 2022-09-24 DIAGNOSIS — Z85828 Personal history of other malignant neoplasm of skin: Secondary | ICD-10-CM | POA: Insufficient documentation

## 2022-09-24 DIAGNOSIS — I10 Essential (primary) hypertension: Secondary | ICD-10-CM | POA: Insufficient documentation

## 2022-09-24 DIAGNOSIS — N289 Disorder of kidney and ureter, unspecified: Secondary | ICD-10-CM | POA: Diagnosis not present

## 2022-09-24 DIAGNOSIS — Z7982 Long term (current) use of aspirin: Secondary | ICD-10-CM | POA: Diagnosis not present

## 2022-09-24 DIAGNOSIS — E785 Hyperlipidemia, unspecified: Secondary | ICD-10-CM | POA: Insufficient documentation

## 2022-09-24 DIAGNOSIS — D696 Thrombocytopenia, unspecified: Secondary | ICD-10-CM | POA: Insufficient documentation

## 2022-09-24 DIAGNOSIS — Z79899 Other long term (current) drug therapy: Secondary | ICD-10-CM | POA: Diagnosis not present

## 2022-09-24 DIAGNOSIS — Z86010 Personal history of colonic polyps: Secondary | ICD-10-CM | POA: Diagnosis not present

## 2022-09-24 LAB — BASIC METABOLIC PANEL
Anion gap: 8 (ref 5–15)
BUN: 22 mg/dL (ref 8–23)
CO2: 21 mmol/L — ABNORMAL LOW (ref 22–32)
Calcium: 9 mg/dL (ref 8.9–10.3)
Chloride: 108 mmol/L (ref 98–111)
Creatinine, Ser: 1.36 mg/dL — ABNORMAL HIGH (ref 0.61–1.24)
GFR, Estimated: 58 mL/min — ABNORMAL LOW (ref >60)
Glucose, Bld: 147 mg/dL — ABNORMAL HIGH (ref 70–99)
Potassium: 3.8 mmol/L (ref 3.5–5.1)
Sodium: 137 mmol/L (ref 135–145)

## 2022-09-24 LAB — CBC WITH DIFFERENTIAL/PLATELET
Abs Immature Granulocytes: 0.02 10*3/uL (ref 0.00–0.07)
Basophils Absolute: 0 10*3/uL (ref 0.0–0.1)
Basophils Relative: 1 %
Eosinophils Absolute: 0.1 10*3/uL (ref 0.0–0.5)
Eosinophils Relative: 2 %
HCT: 39.9 % (ref 39.0–52.0)
Hemoglobin: 13.9 g/dL (ref 13.0–17.0)
Immature Granulocytes: 0 %
Lymphocytes Relative: 22 %
Lymphs Abs: 1.1 10*3/uL (ref 0.7–4.0)
MCH: 29.9 pg (ref 26.0–34.0)
MCHC: 34.8 g/dL (ref 30.0–36.0)
MCV: 85.8 fL (ref 80.0–100.0)
Monocytes Absolute: 0.5 10*3/uL (ref 0.1–1.0)
Monocytes Relative: 9 %
Neutro Abs: 3.3 10*3/uL (ref 1.7–7.7)
Neutrophils Relative %: 66 %
Platelets: 147 10*3/uL — ABNORMAL LOW (ref 150–400)
RBC: 4.65 MIL/uL (ref 4.22–5.81)
RDW: 12.9 % (ref 11.5–15.5)
WBC: 5 10*3/uL (ref 4.0–10.5)
nRBC: 0 % (ref 0.0–0.2)

## 2022-09-24 NOTE — Progress Notes (Signed)
[facility]  [phone] [phone]  ID: [name] OB: Nov 16, 1955  MR#: [id number]  AOZ#:[id number]  Patient Care Team: Danella Penton, MD as PCP - General (Internal Medicine)  CHIEF COMPLAINT: MGUS.  INTERVAL HISTORY: Patient is a 67 year old male who was noted to have a mildly elevated kappa light chain on routine blood work.  He currently feels well and is asymptomatic.  He has no neurologic complaints.  He denies any recent fevers or illnesses.  He has a good appetite and denies weight loss.  He has no chest pain, shortness of breath, cough, or hemoptysis.  He denies any nausea, vomiting, constipation, or diarrhea.  He has no urinary complaints.  Patient feels at baseline and offers no specific complaints today.  REVIEW OF SYSTEMS:   Review of Systems  Constitutional: Negative.  Negative for fever, malaise/fatigue and weight loss.  Respiratory: Negative.  Negative for cough, hemoptysis and shortness of breath.   Cardiovascular: Negative.  Negative for chest pain and leg swelling.  Gastrointestinal: Negative.  Negative for abdominal pain.  Genitourinary: Negative.  Negative for dysuria.  Musculoskeletal: Negative.  Negative for back pain.  Skin: Negative.  Negative for rash.  Neurological: Negative.  Negative for dizziness, focal weakness, weakness and headaches.  Psychiatric/Behavioral: Negative.  The patient is not nervous/anxious.     As per HPI. Otherwise, a complete review of systems is negative.  PAST MEDICAL HISTORY: Past Medical History:  Diagnosis Date   History of myocardial infarction    Hyperlipidemia    Hypertension    Nephrolithiasis    Skin cancer    Tubular adenoma of colon 10/17/2014    PAST SURGICAL HISTORY: Past Surgical History:  Procedure Laterality Date   BACK SURGERY     COLONOSCOPY N/A 10/17/2014   Procedure: COLONOSCOPY;  Surgeon: Elnita Maxwell, MD;  Location: [facility] ENDOSCOPY;  Service:  Endoscopy;  Laterality: N/A;   CORONARY/GRAFT ACUTE MI REVASCULARIZATION N/A 04/20/2019   Procedure: Coronary/Graft Acute MI Revascularization;  Surgeon: Yvonne Kendall, MD;  Location: [facility] INVASIVE CV LAB;  Service: Cardiovascular;  Laterality: N/A;   LEFT HEART CATH AND CORONARY ANGIOGRAPHY N/A 04/20/2019   Procedure: LEFT HEART CATH AND CORONARY ANGIOGRAPHY;  Surgeon: Yvonne Kendall, MD;  Location: [facility] INVASIVE CV LAB;  Service: Cardiovascular;  Laterality: N/A;    FAMILY HISTORY: Family History  Problem Relation Age of Onset   Cancer Maternal Grandmother     ADVANCED DIRECTIVES (Y/N):  N  HEALTH MAINTENANCE: Social History   Tobacco Use   Smoking status: Never   Smokeless tobacco: Current    Types: Chew  Substance Use Topics   Alcohol use: Yes    Alcohol/week: 28.0 standard drinks of alcohol    Types: 28 Glasses of wine per week   Drug use: No     Colonoscopy:  PAP:  Bone density:  Lipid panel:  No Known Allergies  Current Outpatient Medications  Medication Sig Dispense Refill   aspirin 81 MG chewable tablet Chew 1 tablet (81 mg total) by mouth daily. 30 tablet 1   atorvastatin (LIPITOR) 80 MG tablet Take 1 tablet (80 mg total) by mouth daily at 6 PM. (Patient taking differently: Take 80 mg by mouth daily at 6 PM. 1/2 tablet) 30 tablet 1   carvedilol (COREG) 25 MG tablet Take 25 mg by mouth 2 (two) times daily.     colchicine 0.6 MG tablet Take 0.6 mg by mouth 2 (two) times daily.     Cyanocobalamin  1000 MCG SUBL Take by mouth.     doxazosin (CARDURA) 4 MG tablet Take 4 mg by mouth 2 (two) times daily.     KRILL OIL PO Take 100 mg by mouth daily.     nitroGLYCERIN (NITROSTAT) 0.4 MG SL tablet Place 1 tablet (0.4 mg total) under the tongue every 5 (five) minutes as needed for chest pain. 20 tablet 12   sertraline (ZOLOFT) 50 MG tablet Take 50 mg by mouth daily.     sildenafil (REVATIO) 20 MG tablet Take 3-5 tablets one hour prior to intercourse 10 tablet 0    tamsulosin (FLOMAX) 0.4 MG CAPS capsule Take 0.4 mg by mouth daily.     telmisartan (MICARDIS) 80 MG tablet Take 80 mg by mouth daily.     EFFER-K 20 MEQ TBEF TAKE 1 TABLET BY MOUTH THREE TIMES A DAY (Patient not taking: Reported on 09/24/2022) 120 tablet 0   glucosamine-chondroitin 500-400 MG tablet Take 1 tablet by mouth 3 (three) times daily. (Patient not taking: Reported on 09/24/2022)     metoprolol succinate (TOPROL-XL) 50 MG 24 hr tablet Take 50 mg by mouth daily. Take with or immediately following a meal. (Patient not taking: Reported on 09/24/2022)     traMADol (ULTRAM) 50 MG tablet Take 50 mg by mouth 2 (two) times daily as needed. (Patient not taking: Reported on 09/24/2022)     No current facility-administered medications for this visit.    OBJECTIVE: Vitals:   09/24/22 1055  BP: 127/87  Pulse: 70  Resp: 16  Temp: 98.5 F (36.9 C)  SpO2: 99%     Body mass index is 31.9 kg/m.    ECOG FS:0 - Asymptomatic  General: Well-developed, well-nourished, no acute distress. Eyes: Pink conjunctiva, anicteric sclera. HEENT: Normocephalic, moist mucous membranes. Lungs: No audible wheezing or coughing. Heart: Regular rate and rhythm. Abdomen: Soft, nontender, no obvious distention. Musculoskeletal: No edema, cyanosis, or clubbing. Neuro: Alert, answering all questions appropriately. Cranial nerves grossly intact. Skin: No rashes or petechiae noted. Psych: Normal affect. Lymphatics: No cervical, calvicular, axillary or inguinal LAD.   LAB RESULTS:  Lab Results  Component Value Date   NA 137 09/24/2022   K 3.8 09/24/2022   CL 108 09/24/2022   CO2 21 (L) 09/24/2022   GLUCOSE 147 (H) 09/24/2022   BUN 22 09/24/2022   CREATININE 1.36 (H) 09/24/2022   CALCIUM 9.0 09/24/2022   PROT 7.0 04/20/2019   ALBUMIN 4.4 04/20/2019   AST 64 (H) 04/20/2019   ALT 82 (H) 04/20/2019   ALKPHOS 70 04/20/2019   BILITOT 0.8 04/20/2019   GFRNONAA 58 (L) 09/24/2022   GFRAA >60 04/21/2019    Lab  Results  Component Value Date   WBC 5.0 09/24/2022   NEUTROABS 3.3 09/24/2022   HGB 13.9 09/24/2022   HCT 39.9 09/24/2022   MCV 85.8 09/24/2022   PLT 147 (L) 09/24/2022     STUDIES: No results found.  ASSESSMENT: MGUS.  PLAN:    MGUS: Repeat SPEP, kappa/lambda free light chains, and immunoglobulins are pending at time of dictation.  Patient has a mild renal insufficiency which is likely unrelated, but otherwise has no evidence of endorgan damage.  No imaging is necessary.  Patient does not require bone marrow biopsy.  Return to clinic in 3 months with repeat laboratory work and further evaluation. Renal insufficiency: Unrelated.  Patient's most recent creatinine is 1.36. Thrombocytopenia: Patient's platelet count is 147, monitor.  Patient expressed understanding and was in agreement with this plan.  He also understands that He can call clinic at any time with any questions, concerns, or complaints.     Jeralyn Ruths, MD   09/24/2022 12:14 PM

## 2022-09-25 LAB — IGG, IGA, IGM
IgA: 51 mg/dL — ABNORMAL LOW (ref 61–437)
IgG (Immunoglobin G), Serum: 557 mg/dL — ABNORMAL LOW (ref 603–1613)
IgM (Immunoglobulin M), Srm: 8 mg/dL — ABNORMAL LOW (ref 20–172)

## 2022-09-25 LAB — KAPPA/LAMBDA LIGHT CHAINS
Kappa free light chain: 68.4 mg/L — ABNORMAL HIGH (ref 3.3–19.4)
Kappa, lambda light chain ratio: 12.44 — ABNORMAL HIGH (ref 0.26–1.65)
Lambda free light chains: 5.5 mg/L — ABNORMAL LOW (ref 5.7–26.3)

## 2022-09-27 LAB — PROTEIN ELECTROPHORESIS, SERUM
A/G Ratio: 1.8 — ABNORMAL HIGH (ref 0.7–1.7)
Albumin ELP: 4.4 g/dL (ref 2.9–4.4)
Alpha-1-Globulin: 0.2 g/dL (ref 0.0–0.4)
Alpha-2-Globulin: 0.7 g/dL (ref 0.4–1.0)
Beta Globulin: 1 g/dL (ref 0.7–1.3)
Gamma Globulin: 0.6 g/dL (ref 0.4–1.8)
Globulin, Total: 2.4 g/dL (ref 2.2–3.9)
Total Protein ELP: 6.8 g/dL (ref 6.0–8.5)

## 2022-12-25 ENCOUNTER — Other Ambulatory Visit: Payer: Medicare Other

## 2022-12-25 ENCOUNTER — Inpatient Hospital Stay: Payer: Medicare Other | Attending: Oncology

## 2022-12-25 DIAGNOSIS — D472 Monoclonal gammopathy: Secondary | ICD-10-CM | POA: Insufficient documentation

## 2022-12-25 DIAGNOSIS — D696 Thrombocytopenia, unspecified: Secondary | ICD-10-CM | POA: Diagnosis not present

## 2022-12-25 DIAGNOSIS — F17290 Nicotine dependence, other tobacco product, uncomplicated: Secondary | ICD-10-CM | POA: Insufficient documentation

## 2022-12-25 LAB — CBC WITH DIFFERENTIAL/PLATELET
Abs Immature Granulocytes: 0.04 10*3/uL (ref 0.00–0.07)
Basophils Absolute: 0 10*3/uL (ref 0.0–0.1)
Basophils Relative: 1 %
Eosinophils Absolute: 0.2 10*3/uL (ref 0.0–0.5)
Eosinophils Relative: 4 %
HCT: 38.2 % — ABNORMAL LOW (ref 39.0–52.0)
Hemoglobin: 13.3 g/dL (ref 13.0–17.0)
Immature Granulocytes: 1 %
Lymphocytes Relative: 24 %
Lymphs Abs: 1 10*3/uL (ref 0.7–4.0)
MCH: 29.9 pg (ref 26.0–34.0)
MCHC: 34.8 g/dL (ref 30.0–36.0)
MCV: 85.8 fL (ref 80.0–100.0)
Monocytes Absolute: 0.4 10*3/uL (ref 0.1–1.0)
Monocytes Relative: 9 %
Neutro Abs: 2.5 10*3/uL (ref 1.7–7.7)
Neutrophils Relative %: 61 %
Platelets: 120 10*3/uL — ABNORMAL LOW (ref 150–400)
RBC: 4.45 MIL/uL (ref 4.22–5.81)
RDW: 13.2 % (ref 11.5–15.5)
WBC: 4 10*3/uL (ref 4.0–10.5)
nRBC: 0 % (ref 0.0–0.2)

## 2022-12-25 LAB — BASIC METABOLIC PANEL
Anion gap: 8 (ref 5–15)
BUN: 19 mg/dL (ref 8–23)
CO2: 22 mmol/L (ref 22–32)
Calcium: 8.7 mg/dL — ABNORMAL LOW (ref 8.9–10.3)
Chloride: 107 mmol/L (ref 98–111)
Creatinine, Ser: 1.12 mg/dL (ref 0.61–1.24)
GFR, Estimated: >60 mL/min (ref >60)
Glucose, Bld: 135 mg/dL — ABNORMAL HIGH (ref 70–99)
Potassium: 3.9 mmol/L (ref 3.5–5.1)
Sodium: 137 mmol/L (ref 135–145)

## 2022-12-26 LAB — IGG, IGA, IGM
IgA: 42 mg/dL — ABNORMAL LOW (ref 61–437)
IgG (Immunoglobin G), Serum: 523 mg/dL — ABNORMAL LOW (ref 603–1613)
IgM (Immunoglobulin M), Srm: 8 mg/dL — ABNORMAL LOW (ref 20–172)

## 2022-12-26 LAB — KAPPA/LAMBDA LIGHT CHAINS
Kappa free light chain: 79 mg/L — ABNORMAL HIGH (ref 3.3–19.4)
Kappa, lambda light chain ratio: 11.29 — ABNORMAL HIGH (ref 0.26–1.65)
Lambda free light chains: 7 mg/L (ref 5.7–26.3)

## 2022-12-27 LAB — PROTEIN ELECTROPHORESIS, SERUM
A/G Ratio: 1.9 — ABNORMAL HIGH (ref 0.7–1.7)
Albumin ELP: 3.9 g/dL (ref 2.9–4.4)
Alpha-1-Globulin: 0.2 g/dL (ref 0.0–0.4)
Alpha-2-Globulin: 0.6 g/dL (ref 0.4–1.0)
Beta Globulin: 0.9 g/dL (ref 0.7–1.3)
Gamma Globulin: 0.5 g/dL (ref 0.4–1.8)
Globulin, Total: 2.1 g/dL — ABNORMAL LOW (ref 2.2–3.9)
Total Protein ELP: 6 g/dL (ref 6.0–8.5)

## 2023-01-01 ENCOUNTER — Ambulatory Visit: Payer: Medicare Other | Admitting: Oncology

## 2023-01-02 ENCOUNTER — Inpatient Hospital Stay: Payer: Medicare Other | Admitting: Oncology

## 2023-01-02 ENCOUNTER — Encounter: Payer: Self-pay | Admitting: Oncology

## 2023-01-02 VITALS — BP 143/92 | HR 71 | Temp 99.2°F | Resp 16 | Ht 69.0 in | Wt 223.7 lb

## 2023-01-02 DIAGNOSIS — D472 Monoclonal gammopathy: Secondary | ICD-10-CM | POA: Diagnosis not present

## 2023-01-02 NOTE — Progress Notes (Signed)
[facility]  Telephone:(336) [phone] Fax:(336) [phone]  ID: Dennis Peck OB: 1956/03/26  MR#: [id number]  XLK#:[id number]  Patient Care Team: Danella Penton, MD as PCP - General (Internal Medicine)  CHIEF COMPLAINT: MGUS.  INTERVAL HISTORY: Patient returns to clinic today for routine 61-month evaluation and discussion of his laboratory work.  He continues to feel well and remains asymptomatic. He has no neurologic complaints.  He denies any recent fevers or illnesses.  He has a good appetite and denies weight loss.  He has no chest pain, shortness of breath, cough, or hemoptysis.  He denies any nausea, vomiting, constipation, or diarrhea.  He has no urinary complaints.  Patient offers no specific complaints today.  REVIEW OF SYSTEMS:   Review of Systems  Constitutional: Negative.  Negative for fever, malaise/fatigue and weight loss.  Respiratory: Negative.  Negative for cough, hemoptysis and shortness of breath.   Cardiovascular: Negative.  Negative for chest pain and leg swelling.  Gastrointestinal: Negative.  Negative for abdominal pain.  Genitourinary: Negative.  Negative for dysuria.  Musculoskeletal: Negative.  Negative for back pain.  Skin: Negative.  Negative for rash.  Neurological: Negative.  Negative for dizziness, focal weakness, weakness and headaches.  Psychiatric/Behavioral: Negative.  The patient is not nervous/anxious.     As per HPI. Otherwise, a complete review of systems is negative.  PAST MEDICAL HISTORY: Past Medical History:  Diagnosis Date   History of myocardial infarction    Hyperlipidemia    Hypertension    Nephrolithiasis    Skin cancer    Tubular adenoma of colon 10/17/2014    PAST SURGICAL HISTORY: Past Surgical History:  Procedure Laterality Date   BACK SURGERY     COLONOSCOPY N/A 10/17/2014   Procedure: COLONOSCOPY;  Surgeon: Elnita Maxwell, MD;  Location: [facility] ENDOSCOPY;  Service: Endoscopy;  Laterality: N/A;    CORONARY/GRAFT ACUTE MI REVASCULARIZATION N/A 04/20/2019   Procedure: Coronary/Graft Acute MI Revascularization;  Surgeon: Yvonne Kendall, MD;  Location: [facility] INVASIVE CV LAB;  Service: Cardiovascular;  Laterality: N/A;   LEFT HEART CATH AND CORONARY ANGIOGRAPHY N/A 04/20/2019   Procedure: LEFT HEART CATH AND CORONARY ANGIOGRAPHY;  Surgeon: Yvonne Kendall, MD;  Location: [facility] INVASIVE CV LAB;  Service: Cardiovascular;  Laterality: N/A;    FAMILY HISTORY: Family History  Problem Relation Age of Onset   Cancer Maternal Grandmother     ADVANCED DIRECTIVES (Y/N):  N  HEALTH MAINTENANCE: Social History   Tobacco Use   Smoking status: Never   Smokeless tobacco: Current    Types: Chew  Substance Use Topics   Alcohol use: Yes    Alcohol/week: 28.0 standard drinks of alcohol    Types: 28 Glasses of wine per week   Drug use: No     Colonoscopy:  PAP:  Bone density:  Lipid panel:  No Known Allergies  Current Outpatient Medications  Medication Sig Dispense Refill   aspirin 81 MG chewable tablet Chew 1 tablet (81 mg total) by mouth daily. 30 tablet 1   atorvastatin (LIPITOR) 80 MG tablet Take 1 tablet (80 mg total) by mouth daily at 6 PM. (Patient taking differently: Take 80 mg by mouth daily at 6 PM. 1/2 tablet) 30 tablet 1   carvedilol (COREG) 25 MG tablet Take 25 mg by mouth 2 (two) times daily.     colchicine 0.6 MG tablet Take 0.6 mg by mouth 2 (two) times daily.     Cyanocobalamin 1000 MCG SUBL Take by mouth.  doxazosin (CARDURA) 4 MG tablet Take 4 mg by mouth 2 (two) times daily.     KRILL OIL PO Take 100 mg by mouth daily.     nitroGLYCERIN (NITROSTAT) 0.4 MG SL tablet Place 1 tablet (0.4 mg total) under the tongue every 5 (five) minutes as needed for chest pain. 20 tablet 12   sertraline (ZOLOFT) 50 MG tablet Take 50 mg by mouth daily.     sildenafil (REVATIO) 20 MG tablet Take 3-5 tablets one hour prior to intercourse 10 tablet 0   tamsulosin (FLOMAX) 0.4 MG CAPS  capsule Take 0.4 mg by mouth daily.     telmisartan (MICARDIS) 80 MG tablet Take 80 mg by mouth daily.     EFFER-K 20 MEQ TBEF TAKE 1 TABLET BY MOUTH THREE TIMES A DAY (Patient not taking: Reported on 09/24/2022) 120 tablet 0   glucosamine-chondroitin 500-400 MG tablet Take 1 tablet by mouth 3 (three) times daily. (Patient not taking: Reported on 09/24/2022)     metoprolol succinate (TOPROL-XL) 50 MG 24 hr tablet Take 50 mg by mouth daily. Take with or immediately following a meal. (Patient not taking: Reported on 09/24/2022)     traMADol (ULTRAM) 50 MG tablet Take 50 mg by mouth 2 (two) times daily as needed. (Patient not taking: Reported on 09/24/2022)     No current facility-administered medications for this visit.    OBJECTIVE: Vitals:   01/02/23 1352  BP: (!) 143/92  Pulse: 71  Resp: 16  Temp: 99.2 F (37.3 C)  SpO2: 99%     Body mass index is 33.03 kg/m.    ECOG FS:0 - Asymptomatic  General: Well-developed, well-nourished, no acute distress. Eyes: Pink conjunctiva, anicteric sclera. HEENT: Normocephalic, moist mucous membranes. Lungs: No audible wheezing or coughing. Heart: Regular rate and rhythm. Abdomen: Soft, nontender, no obvious distention. Musculoskeletal: No edema, cyanosis, or clubbing. Neuro: Alert, answering all questions appropriately. Cranial nerves grossly intact. Skin: No rashes or petechiae noted. Psych: Normal affect.   LAB RESULTS:  Lab Results  Component Value Date   NA 137 12/25/2022   K 3.9 12/25/2022   CL 107 12/25/2022   CO2 22 12/25/2022   GLUCOSE 135 (H) 12/25/2022   BUN 19 12/25/2022   CREATININE 1.12 12/25/2022   CALCIUM 8.7 (L) 12/25/2022   PROT 7.0 04/20/2019   ALBUMIN 4.4 04/20/2019   AST 64 (H) 04/20/2019   ALT 82 (H) 04/20/2019   ALKPHOS 70 04/20/2019   BILITOT 0.8 04/20/2019   GFRNONAA >60 12/25/2022   GFRAA >60 04/21/2019    Lab Results  Component Value Date   WBC 4.0 12/25/2022   NEUTROABS 2.5 12/25/2022   HGB 13.3  12/25/2022   HCT 38.2 (L) 12/25/2022   MCV 85.8 12/25/2022   PLT 120 (L) 12/25/2022     STUDIES: No results found.  ASSESSMENT: MGUS.  PLAN:    MGUS: SPEP and immunoglobulins continue to be negative.  Patient has a mildly elevated kappa chain level ranging from 68.4 79.0.  He has no evidence of endorgan damage.  No intervention is needed.  Patient does not require imaging or bone marrow biopsy.  Return to clinic in 6 months with repeat laboratory work and further evaluation.  If his laboratory work remains essentially stable, he could likely be transition to yearly visits.   Renal insufficiency: Resolved. Thrombocytopenia: Count has trended down slightly to 120.  Monitor.  Patient expressed understanding and was in agreement with this plan. He also understands that He can call  clinic at any time with any questions, concerns, or complaints.     Jeralyn Ruths, MD   01/02/2023 3:57 PM

## 2023-02-03 IMAGING — CR DG ABDOMEN 1V
2 series · 2 of 2 positions shown · non-contrast
Comparison: Abdominal radiograph dated 07/12/2020.

CLINICAL DATA: Kidney stones.

EXAM:
ABDOMEN - 1 VIEW

[abdomen kub (1 of 2)]
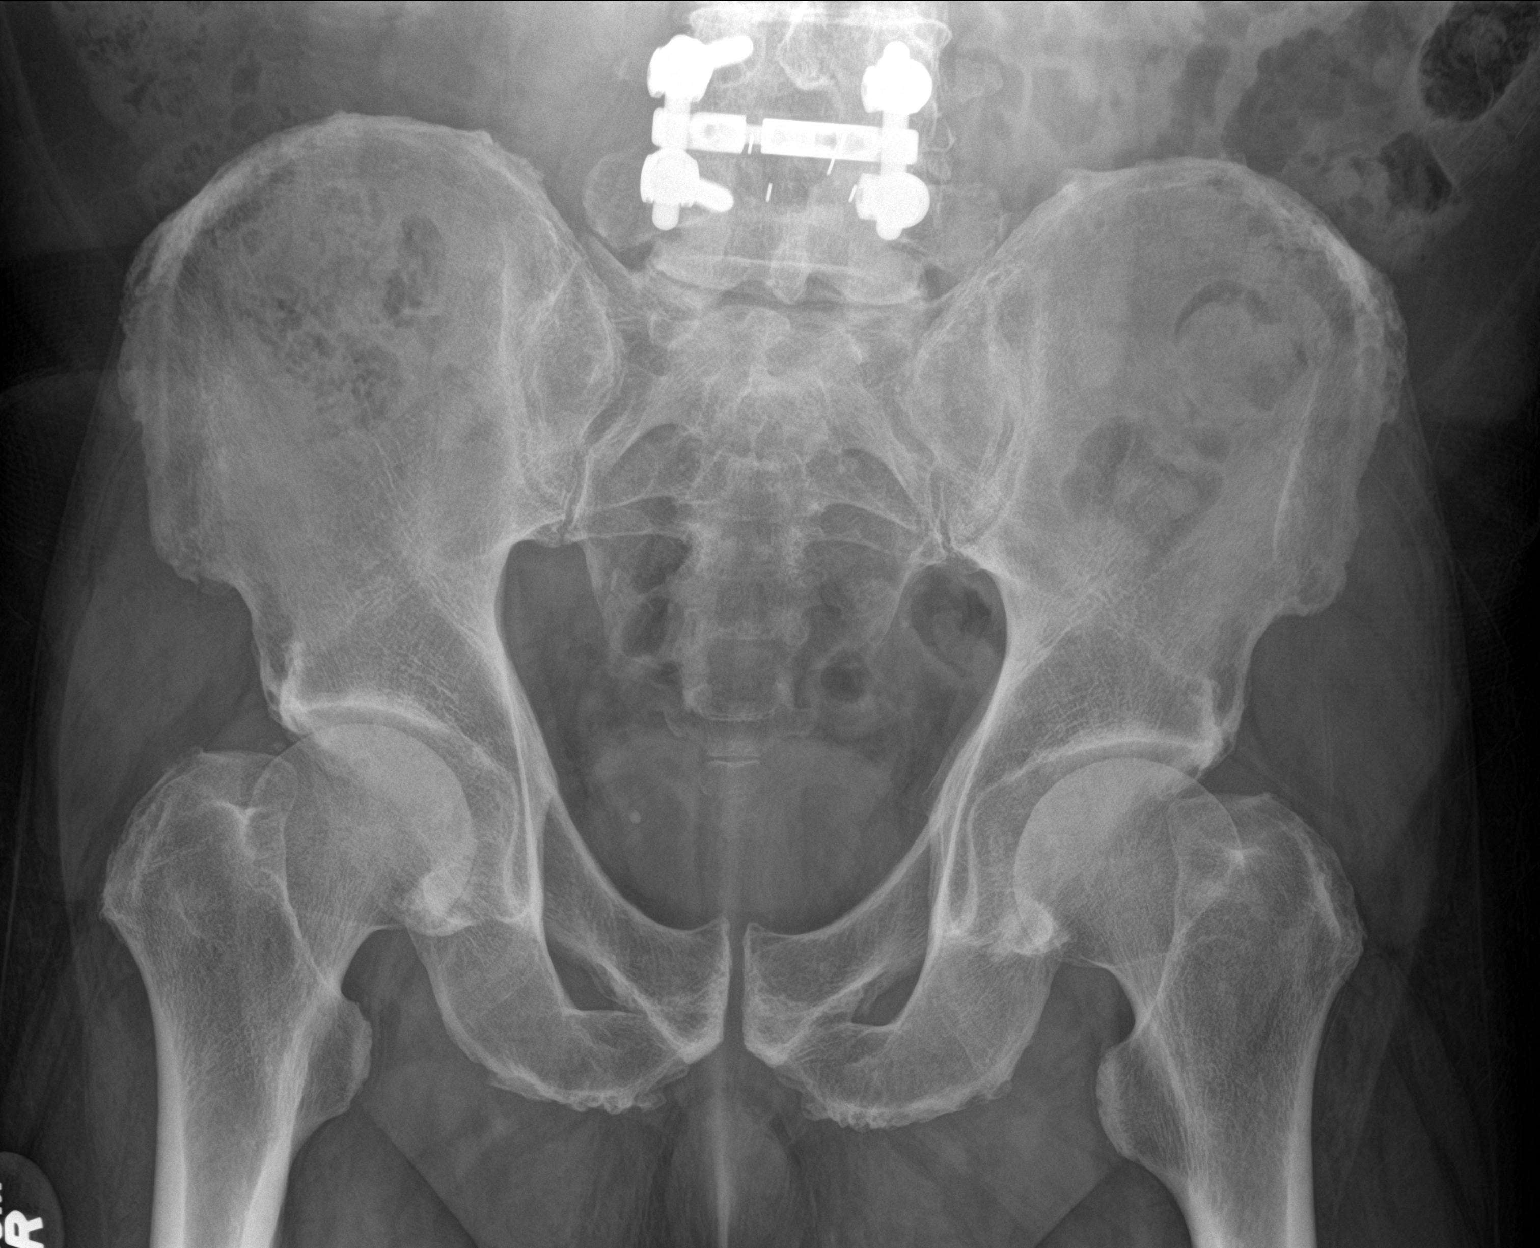

[abdomen kub (2 of 2)]
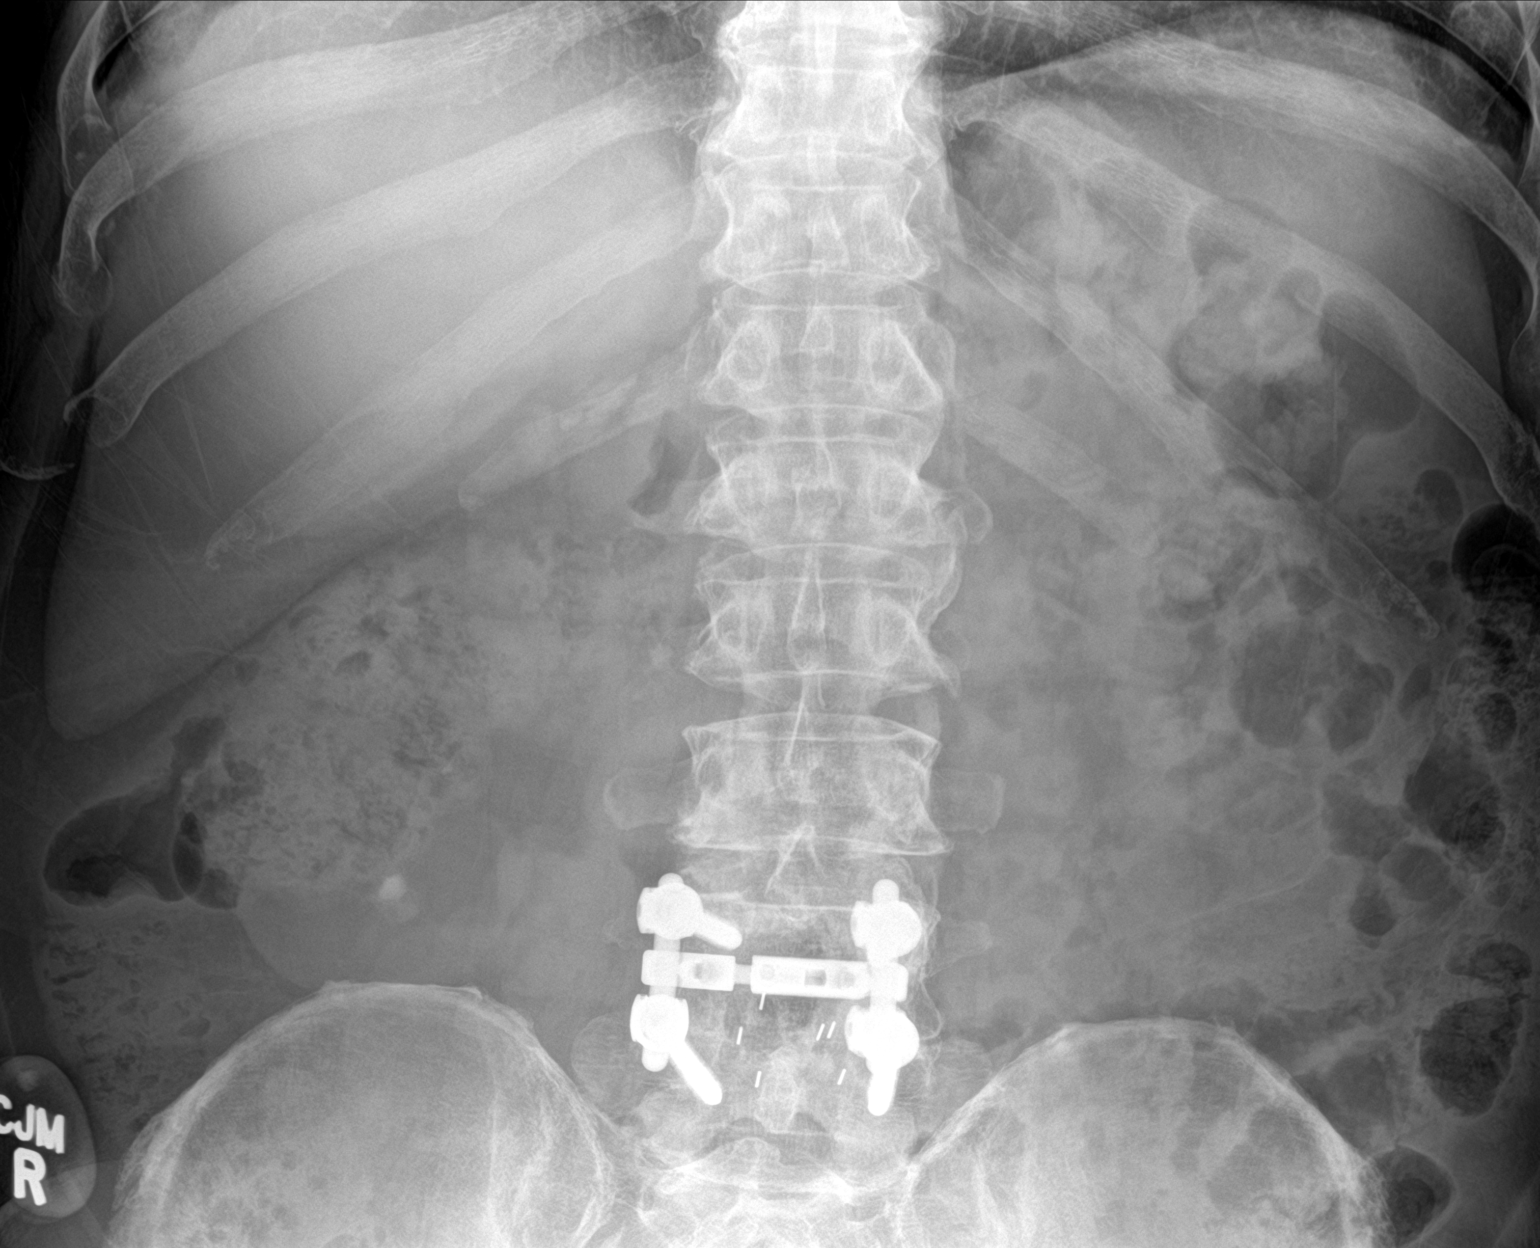

[2 of 2 positions shown; findings below may reference images not displayed]

FINDINGS: A 15 x 8 mm stone over the inferior pole of the right kidney. No
other radiopaque calculi identified.

Moderate stool throughout the colon. No bowel dilatation or evidence
of obstruction. No free air. Degenerative changes of the spine and
lower lumbar fusion hardware. No acute osseous pathology.
IMPRESSION: Right renal inferior pole calculus.

## 2023-07-02 ENCOUNTER — Inpatient Hospital Stay: Payer: Self-pay | Attending: Oncology

## 2023-07-02 DIAGNOSIS — D72819 Decreased white blood cell count, unspecified: Secondary | ICD-10-CM | POA: Diagnosis not present

## 2023-07-02 DIAGNOSIS — D472 Monoclonal gammopathy: Secondary | ICD-10-CM

## 2023-07-02 DIAGNOSIS — D696 Thrombocytopenia, unspecified: Secondary | ICD-10-CM | POA: Diagnosis not present

## 2023-07-02 LAB — CBC WITH DIFFERENTIAL/PLATELET
Abs Immature Granulocytes: 0.04 10*3/uL (ref 0.00–0.07)
Basophils Absolute: 0 10*3/uL (ref 0.0–0.1)
Basophils Relative: 0 %
Eosinophils Absolute: 0.1 10*3/uL (ref 0.0–0.5)
Eosinophils Relative: 2 %
HCT: 34.8 % — ABNORMAL LOW (ref 39.0–52.0)
Hemoglobin: 12.4 g/dL — ABNORMAL LOW (ref 13.0–17.0)
Immature Granulocytes: 1 %
Lymphocytes Relative: 28 %
Lymphs Abs: 1.1 10*3/uL (ref 0.7–4.0)
MCH: 30.8 pg (ref 26.0–34.0)
MCHC: 35.6 g/dL (ref 30.0–36.0)
MCV: 86.4 fL (ref 80.0–100.0)
Monocytes Absolute: 0.4 10*3/uL (ref 0.1–1.0)
Monocytes Relative: 11 %
Neutro Abs: 2.2 10*3/uL (ref 1.7–7.7)
Neutrophils Relative %: 58 %
Platelets: 136 10*3/uL — ABNORMAL LOW (ref 150–400)
RBC: 4.03 MIL/uL — ABNORMAL LOW (ref 4.22–5.81)
RDW: 14.6 % (ref 11.5–15.5)
WBC: 3.8 10*3/uL — ABNORMAL LOW (ref 4.0–10.5)
nRBC: 0 % (ref 0.0–0.2)

## 2023-07-02 LAB — BASIC METABOLIC PANEL
Anion gap: 7 (ref 5–15)
BUN: 19 mg/dL (ref 8–23)
CO2: 23 mmol/L (ref 22–32)
Calcium: 8.9 mg/dL (ref 8.9–10.3)
Chloride: 105 mmol/L (ref 98–111)
Creatinine, Ser: 1.19 mg/dL (ref 0.61–1.24)
GFR, Estimated: >60 mL/min (ref >60)
Glucose, Bld: 152 mg/dL — ABNORMAL HIGH (ref 70–99)
Potassium: 3.7 mmol/L (ref 3.5–5.1)
Sodium: 135 mmol/L (ref 135–145)

## 2023-07-03 LAB — KAPPA/LAMBDA LIGHT CHAINS
Kappa free light chain: 75.1 mg/L — ABNORMAL HIGH (ref 3.3–19.4)
Kappa, lambda light chain ratio: 12.73 — ABNORMAL HIGH (ref 0.26–1.65)
Lambda free light chains: 5.9 mg/L (ref 5.7–26.3)

## 2023-07-04 LAB — IGG, IGA, IGM
IgA: 59 mg/dL — ABNORMAL LOW (ref 61–437)
IgG (Immunoglobin G), Serum: 504 mg/dL — ABNORMAL LOW (ref 603–1613)
IgM (Immunoglobulin M), Srm: 19 mg/dL — ABNORMAL LOW (ref 20–172)

## 2023-07-07 LAB — PROTEIN ELECTROPHORESIS, SERUM
A/G Ratio: 1.8 — ABNORMAL HIGH (ref 0.7–1.7)
Albumin ELP: 4.1 g/dL (ref 2.9–4.4)
Alpha-1-Globulin: 0.2 g/dL (ref 0.0–0.4)
Alpha-2-Globulin: 0.7 g/dL (ref 0.4–1.0)
Beta Globulin: 1 g/dL (ref 0.7–1.3)
Gamma Globulin: 0.4 g/dL (ref 0.4–1.8)
Globulin, Total: 2.3 g/dL (ref 2.2–3.9)
M-Spike, %: 0.1 g/dL — ABNORMAL HIGH
Total Protein ELP: 6.4 g/dL (ref 6.0–8.5)

## 2023-07-10 ENCOUNTER — Inpatient Hospital Stay: Payer: Medicare Other | Admitting: Oncology

## 2023-07-10 ENCOUNTER — Encounter: Payer: Self-pay | Admitting: Oncology

## 2023-07-10 VITALS — BP 148/100 | HR 74 | Temp 98.3°F | Resp 18 | Ht 69.0 in | Wt 226.7 lb

## 2023-07-10 DIAGNOSIS — D472 Monoclonal gammopathy: Secondary | ICD-10-CM | POA: Diagnosis not present

## 2023-07-10 NOTE — Progress Notes (Signed)
 Went to dermatologist and had places removed in February. He wanted to make sure doctor was aware.

## 2023-07-10 NOTE — Progress Notes (Signed)
 [facility]  Telephone:(336) [phone] Fax:(336) [phone]  ID: [name] OB: 04-06-1956  MR#: [id number]  ZDG#:[id number]  Patient Care Team: Danella Penton, MD as PCP - General (Internal Medicine)  CHIEF COMPLAINT: MGUS.  INTERVAL HISTORY: Patient returns to clinic today for routine 73-month evaluation and discussion of his laboratory results.  He continues to feel well and remains asymptomatic.  He has no neurologic complaints.  He denies any recent fevers or illnesses.  He has a good appetite and denies weight loss.  He has no chest pain, shortness of breath, cough, or hemoptysis.  He denies any nausea, vomiting, constipation, or diarrhea.  He has no urinary complaints.  Patient offers no specific complaints today.  REVIEW OF SYSTEMS:   Review of Systems  Constitutional: Negative.  Negative for fever, malaise/fatigue and weight loss.  Respiratory: Negative.  Negative for cough, hemoptysis and shortness of breath.   Cardiovascular: Negative.  Negative for chest pain and leg swelling.  Gastrointestinal: Negative.  Negative for abdominal pain.  Genitourinary: Negative.  Negative for dysuria.  Musculoskeletal: Negative.  Negative for back pain.  Skin: Negative.  Negative for rash.  Neurological: Negative.  Negative for dizziness, focal weakness, weakness and headaches.  Psychiatric/Behavioral: Negative.  The patient is not nervous/anxious.     As per HPI. Otherwise, a complete review of systems is negative.  PAST MEDICAL HISTORY: Past Medical History:  Diagnosis Date   History of myocardial infarction    Hyperlipidemia    Hypertension    Nephrolithiasis    Skin cancer    Tubular adenoma of colon 10/17/2014    PAST SURGICAL HISTORY: Past Surgical History:  Procedure Laterality Date   BACK SURGERY     COLONOSCOPY N/A 10/17/2014   Procedure: COLONOSCOPY;  Surgeon: Elnita Maxwell, MD;  Location: [facility] ENDOSCOPY;  Service: Endoscopy;  Laterality:  N/A;   CORONARY/GRAFT ACUTE MI REVASCULARIZATION N/A 04/20/2019   Procedure: Coronary/Graft Acute MI Revascularization;  Surgeon: Yvonne Kendall, MD;  Location: [facility] INVASIVE CV LAB;  Service: Cardiovascular;  Laterality: N/A;   LEFT HEART CATH AND CORONARY ANGIOGRAPHY N/A 04/20/2019   Procedure: LEFT HEART CATH AND CORONARY ANGIOGRAPHY;  Surgeon: Yvonne Kendall, MD;  Location: [facility] INVASIVE CV LAB;  Service: Cardiovascular;  Laterality: N/A;    FAMILY HISTORY: Family History  Problem Relation Age of Onset   Cancer Maternal Grandmother     ADVANCED DIRECTIVES (Y/N):  N  HEALTH MAINTENANCE: Social History   Tobacco Use   Smoking status: Never   Smokeless tobacco: Current    Types: Chew  Substance Use Topics   Alcohol use: Yes    Alcohol/week: 28.0 standard drinks of alcohol    Types: 28 Glasses of wine per week   Drug use: No     Colonoscopy:  PAP:  Bone density:  Lipid panel:  No Known Allergies  Current Outpatient Medications  Medication Sig Dispense Refill   aspirin 81 MG chewable tablet Chew 1 tablet (81 mg total) by mouth daily. 30 tablet 1   atorvastatin (LIPITOR) 80 MG tablet Take 1 tablet (80 mg total) by mouth daily at 6 PM. (Patient taking differently: Take 80 mg by mouth daily at 6 PM. 1/2 tablet) 30 tablet 1   carvedilol (COREG) 25 MG tablet Take 25 mg by mouth 2 (two) times daily.     colchicine 0.6 MG tablet Take 0.6 mg by mouth 2 (two) times daily.     Cyanocobalamin 1000 MCG SUBL Take by mouth.  doxazosin (CARDURA) 4 MG tablet Take 4 mg by mouth 2 (two) times daily.     EFFER-K 20 MEQ TBEF TAKE 1 TABLET BY MOUTH THREE TIMES A DAY 120 tablet 0   glucosamine-chondroitin 500-400 MG tablet Take 1 tablet by mouth 3 (three) times daily.     KRILL OIL PO Take 100 mg by mouth daily.     metoprolol succinate (TOPROL-XL) 50 MG 24 hr tablet Take 50 mg by mouth daily. Take with or immediately following a meal.     nitroGLYCERIN (NITROSTAT) 0.4 MG SL tablet  Place 1 tablet (0.4 mg total) under the tongue every 5 (five) minutes as needed for chest pain. 20 tablet 12   sertraline (ZOLOFT) 50 MG tablet Take 50 mg by mouth daily.     sildenafil (REVATIO) 20 MG tablet Take 3-5 tablets one hour prior to intercourse 10 tablet 0   tamsulosin (FLOMAX) 0.4 MG CAPS capsule Take 0.4 mg by mouth daily.     telmisartan (MICARDIS) 80 MG tablet Take 80 mg by mouth daily.     traMADol (ULTRAM) 50 MG tablet Take 50 mg by mouth 2 (two) times daily as needed.     No current facility-administered medications for this visit.    OBJECTIVE: Vitals:   07/10/23 1406  BP: (!) 148/100  Pulse: 74  Resp: 18  Temp: 98.3 F (36.8 C)  SpO2: 100%     Body mass index is 33.48 kg/m.    ECOG FS:0 - Asymptomatic  General: Well-developed, well-nourished, no acute distress. Eyes: Pink conjunctiva, anicteric sclera. HEENT: Normocephalic, moist mucous membranes. Lungs: No audible wheezing or coughing. Heart: Regular rate and rhythm. Abdomen: Soft, nontender, no obvious distention. Musculoskeletal: No edema, cyanosis, or clubbing. Neuro: Alert, answering all questions appropriately. Cranial nerves grossly intact. Skin: No rashes or petechiae noted. Psych: Normal affect.  LAB RESULTS:  Lab Results  Component Value Date   NA 135 07/02/2023   K 3.7 07/02/2023   CL 105 07/02/2023   CO2 23 07/02/2023   GLUCOSE 152 (H) 07/02/2023   BUN 19 07/02/2023   CREATININE 1.19 07/02/2023   CALCIUM 8.9 07/02/2023   PROT 7.0 04/20/2019   ALBUMIN 4.4 04/20/2019   AST 64 (H) 04/20/2019   ALT 82 (H) 04/20/2019   ALKPHOS 70 04/20/2019   BILITOT 0.8 04/20/2019   GFRNONAA >60 07/02/2023   GFRAA >60 04/21/2019    Lab Results  Component Value Date   WBC 3.8 (L) 07/02/2023   NEUTROABS 2.2 07/02/2023   HGB 12.4 (L) 07/02/2023   HCT 34.8 (L) 07/02/2023   MCV 86.4 07/02/2023   PLT 136 (L) 07/02/2023     STUDIES: No results found.  ASSESSMENT: MGUS.  PLAN:    MGUS:  Patient noted to have an M spike of 0.1.  Kappa free light chains have ranged between 68.4 and 79.0 since June 2024.  His most recent result is 75.1.  Immunoglobulins are negative.  He has no evidence of endorgan damage.  No intervention is needed.  Patient does not require imaging or bone marrow biopsy.  Return to in 1 year with repeat laboratory work and further evaluation. Thrombocytopenia: Chronic and unchanged.  Patient's platelet count is 136. Leukopenia: Mild, monitor. Renal insufficiency: Resolved.  Patient expressed understanding and was in agreement with this plan. He also understands that He can call clinic at any time with any questions, concerns, or complaints.     Jeralyn Ruths, MD   07/10/2023 2:22 PM

## 2024-07-08 ENCOUNTER — Other Ambulatory Visit

## 2024-07-15 ENCOUNTER — Ambulatory Visit: Admitting: Oncology
